# Patient Record
Sex: Female | Born: 1970 | Race: White | Hispanic: No | Marital: Married | Smoking: Former smoker
Health system: Southern US, Community
[De-identification: ages and names within clinical notes are randomized; demographics above are authoritative.]

## PROBLEM LIST (undated history)

## (undated) DIAGNOSIS — C44300 Unspecified malignant neoplasm of skin of unspecified part of face: Secondary | ICD-10-CM

## (undated) DIAGNOSIS — E119 Type 2 diabetes mellitus without complications: Secondary | ICD-10-CM

## (undated) HISTORY — DX: Type 2 diabetes mellitus without complications: E11.9

## (undated) HISTORY — PX: ABDOMINAL HYSTERECTOMY: SHX81

## (undated) HISTORY — PX: APPENDECTOMY: SHX54

## (undated) HISTORY — PX: SKIN CANCER EXCISION: SHX779

## (undated) HISTORY — PX: INCISION AND DRAINAGE ABSCESS: SHX5864

## (undated) HISTORY — PX: SKIN BIOPSY: SHX1

---

## 2010-05-17 LAB — HM PAP SMEAR: HM Pap smear: NORMAL

## 2010-12-16 LAB — HM MAMMOGRAPHY: HM Mammogram: NORMAL

## 2015-05-26 ENCOUNTER — Encounter: Payer: Self-pay | Admitting: Gynecology

## 2015-05-26 ENCOUNTER — Ambulatory Visit
Admission: EM | Admit: 2015-05-26 | Discharge: 2015-05-26 | Disposition: A | Discharge status: Home / Self Care | Payer: BLUE CROSS/BLUE SHIELD | Attending: Family Medicine | Admitting: Family Medicine

## 2015-05-26 DIAGNOSIS — N762 Acute vulvitis: Secondary | ICD-10-CM

## 2015-05-26 DIAGNOSIS — N764 Abscess of vulva: Secondary | ICD-10-CM

## 2015-05-26 HISTORY — DX: Unspecified malignant neoplasm of skin of unspecified part of face: C44.300

## 2015-05-26 MED ORDER — ONDANSETRON 4 MG PO TBDP: 4.0000 mg | ORAL_TABLET | Freq: Three times a day (TID) | ORAL | Status: DC | PRN

## 2015-05-26 MED ORDER — SULFAMETHOXAZOLE-TRIMETHOPRIM 800-160 MG PO TABS
2.0000 | ORAL_TABLET | Freq: Once | ORAL | Status: AC
  Administered 2015-05-26: 2 via ORAL

## 2015-05-26 MED ORDER — SULFAMETHOXAZOLE-TRIMETHOPRIM 400-80 MG PO TABS: 2.0000 | ORAL_TABLET | Freq: Once | ORAL | Status: DC

## 2015-05-26 MED ORDER — SULFAMETHOXAZOLE-TRIMETHOPRIM 800-160 MG PO TABS: 2.0000 | ORAL_TABLET | Freq: Two times a day (BID) | ORAL | Status: DC

## 2015-05-26 MED ORDER — OXYCODONE-ACETAMINOPHEN 5-325 MG PO TABS: 1.0000 | ORAL_TABLET | Freq: Three times a day (TID) | ORAL | Status: DC | PRN

## 2015-05-26 NOTE — Discharge Instructions (Signed)
Take medication as prescribed. Apply warm compresses multiple times per day as discussed. Use sitz baths. Keep clean. Do not pull packing out.   Follow up with your primary care physician or the above in 2 days, see above to call first thing tomorrow morning. Return to Urgent care in 2 days if unable to be seen.   Return to Urgent care sooner or go to ER for fever, increased pain, difficulty walking, urinary or bowel changes, inability to tolerate food or fluids or medications, new or worsening concerns.   Return to Urgent   Abscess An abscess is an infected area that contains a collection of pus and debris.It can occur in almost any part of the body. An abscess is also known as a furuncle or boil. CAUSES  An abscess occurs when tissue gets infected. This can occur from blockage of oil or sweat glands, infection of hair follicles, or a minor injury to the skin. As the body tries to fight the infection, pus collects in the area and creates pressure under the skin. This pressure causes pain. People with weakened immune systems have difficulty fighting infections and get certain abscesses more often.  SYMPTOMS Usually an abscess develops on the skin and becomes a painful mass that is red, warm, and tender. If the abscess forms under the skin, you may feel a moveable soft area under the skin. Some abscesses break open (rupture) on their own, but most will continue to get worse without care. The infection can spread deeper into the body and eventually into the bloodstream, causing you to feel ill.  DIAGNOSIS  Your caregiver will take your medical history and perform a physical exam. A sample of fluid may also be taken from the abscess to determine what is causing your infection. TREATMENT  Your caregiver may prescribe antibiotic medicines to fight the infection. However, taking antibiotics alone usually does not cure an abscess. Your caregiver may need to make a small cut (incision) in the abscess to  drain the pus. In some cases, gauze is packed into the abscess to reduce pain and to continue draining the area. HOME CARE INSTRUCTIONS   Only take over-the-counter or prescription medicines for pain, discomfort, or fever as directed by your caregiver.  If you were prescribed antibiotics, take them as directed. Finish them even if you start to feel better.  If gauze is used, follow your caregiver's directions for changing the gauze.  To avoid spreading the infection:  Keep your draining abscess covered with a bandage.  Wash your hands well.  Do not share personal care items, towels, or whirlpools with others.  Avoid skin contact with others.  Keep your skin and clothes clean around the abscess.  Keep all follow-up appointments as directed by your caregiver. SEEK MEDICAL CARE IF:   You have increased pain, swelling, redness, fluid drainage, or bleeding.  You have muscle aches, chills, or a general ill feeling.  You have a fever. MAKE SURE YOU:   Understand these instructions.  Will watch your condition.  Will get help right away if you are not doing well or get worse.   This information is not intended to replace advice given to you by your health care provider. Make sure you discuss any questions you have with your health care provider.   Document Released: 02/10/2005 Document Revised: 11/02/2011 Document Reviewed: 07/16/2011 Elsevier Interactive Patient Education [employer].

## 2015-05-26 NOTE — ED Notes (Signed)
Patient c/o left side labia / vaginal swelling / lump x 3 days. Patient stated warm to the touch / painful and fever at home of 101.3. Per patient pus / drainage x yesterday.

## 2015-05-26 NOTE — ED Provider Notes (Signed)
[facility]  ____________________________________________  Time seen: Approximately 8:27 PM  I have reviewed the triage vital signs and the nursing notes.   HISTORY  Chief Complaint Groin Swelling   HPI Linda Peterson is a 45 y.o. female presents with complaints of left labial redness and swelling. Patient reports that this onset has been over 3 days. Patient reports that yesterday after sitting in a bathtub she squeezed the area very firmly and caused some drainage to be expressed. Patient states that she has not had any drainage since that time. Reports the area continues to have some more swolling. Reports is very tender to touch. Also reports that when she walks feels that her pants rub the area causing increased pain. Denies history of similar in the past. Denies history of abscesses or MRSA infections. Patient reports that she felt like she had ingrown hair that initiated this problem. Denies any vaginal complaints, vaginal discharge, vaginal complaints. Denies any changes in sexual partners. Denies dysuria or difficulty with bowel movements. Denies nausea, vomiting or diarrhea. Denies rectal pain.  States current pain is 5 out of 10 throbbing and tender to touch. Denies pain radiation. Patient also reports that yesterday that she did have a fever at MAXIMUM TEMPERATURE 101.3 orally. Denies recent fevers otherwise. Reports is taken with over-the-counter ibuprofen and Tylenol today. Patient states she had to leave work today early and needs work note. Reports history of total hysterectomy.  Denies other pain or complaints.  PCP: in [place]   Past Medical History  Diagnosis Date  . Skin cancer of face     There are no active problems to display for this patient.   Past Surgical History  Procedure Laterality Date  . Abdominal hysterectomy    . Appendectomy    . Skin biopsy      face  . Skin cancer excision      Current Outpatient Rx  Name  Route  Sig  Dispense   Refill  . ibuprofen (ADVIL,MOTRIN) 200 MG tablet   Oral   Take 200 mg by mouth every 6 (six) hours as needed.           Allergies Review of patient's allergies indicates no known allergies.  No family history on file.  Social History Social History  Substance Use Topics  . Smoking status: Never Smoker   . Smokeless tobacco: None  . Alcohol Use: Yes    Review of Systems Constitutional: Report of fever Eyes: No visual changes. ENT: No sore throat. Cardiovascular: Denies chest pain. Respiratory: Denies shortness of breath. Gastrointestinal: No abdominal pain.  No nausea, no vomiting.  No diarrhea.  No constipation. Genitourinary: Negative for dysuria. Left labial redness and swelling. Musculoskeletal: Negative for back pain. Skin: Negative for rash. Neurological: Negative for headaches, focal weakness or numbness.  10-point ROS otherwise negative.  ____________________________________________   PHYSICAL EXAM:  VITAL SIGNS: ED Triage Vitals  Enc Vitals Group     BP 05/26/15 1949 123/85 mmHg     Pulse Rate 05/26/15 1949 102 Recheck 96      Resp 05/26/15 1949 16     Temp 05/26/15 1949 98.3 F (36.8 C)     Temp Source 05/26/15 1949 Oral     SpO2 05/26/15 1949 99 %     Weight 05/26/15 1949 180 lb (81.647 kg)     Height 05/26/15 1949 5\' 5"  (1.651 m)     Head Cir --      Peak Flow --  Pain Score --      Pain Loc --      Pain Edu? --      Excl. in [place]? --     Constitutional: Alert and oriented. Well appearing and in no acute distress. Eyes: Conjunctivae are normal. PERRL. EOMI. Head: Atraumatic.  Nose: No congestion/rhinnorhea.  Mouth/Throat: Mucous membranes are moist. Cardiovascular: Normal rate, regular rhythm. Grossly normal heart sounds.  Good peripheral circulation. Respiratory: Normal respiratory effort.  No retractions. Lungs CTAB. Gastrointestinal: Soft and nontender. Obese abdomen. Normal Bowel sounds.   Musculoskeletal: No lower or upper  extremity tenderness nor edema. Genitourinary: Exam performed with RN Annamary Carolin at bedside. Left lower exterior labia moderate swelling, mild to moderate erythema, mild to moderate induration with mild fluctuance, slight erythema and induration extending towards left upper leg at inguinal crease, no perirectal induration or fluctuance or erythema, left lateral labial superficial crusting noted at area consistent with location for patient reported active drainage yesterday, mild to moderate tenderness to palpation. No other rash or lesion noted. No perineal tenderness or erythema or induration.  Neurologic:  Normal speech and language. No gross focal neurologic deficits are appreciated. No gait instability. Skin:  Skin is warm, dry and intact. No rash noted. Psychiatric: Mood and affect are normal. Speech and behavior are normal.  ____________________________________________   LABS (all labs ordered are listed, but only abnormal results are displayed)  Labs Reviewed  WOUND CULTURE     PROCEDURES  Procedure(s) performed:   Procedure(s) performed:  Procedure(s) performed:  Procedure explained and verbal consent obtained. Consent: Verbal consent obtained. Written consent not obtained. Risks and benefits: risks, benefits and alternatives were discussed Patient identity confirmed: verbally with patient and hospital-assigned identification number  Consent given by: patient   I&D performed with Annamary Carolin RN at bedside. I&D abscess Location: Left labia Preparation: Patient was prepped and draped in the usual sterile fashion. Anesthesia with 1% Lidocaine 5 mls Irrigation solution: betadine Amount of cleaning: copious Incision made with #11 blade scalpel Wound culture obtained Moderate purulent drainage immediately obtained with expression. Sterile forceps used to probe and break up loculations.  1/4 " iodoform gauze used and packed.  Patient tolerate well. Wound well approximated post  repair.  dressing applied.  Wound care instructions provided.  Observe for any signs of infection or other problems.    ____________________________________________   INITIAL IMPRESSION / ASSESSMENT AND PLAN / ED COURSE  Pertinent labs & imaging results that were available during my care of the patient were reviewed by me and considered in my medical decision making (see chart for details).  Very well-appearing patient. No acute distress. Presents with complaints of 3 days of left labial redness and swelling. Reports some drainage yesterday but none since. Denies other rash or complaints. Subjective report of fevers at home. Afebrile at this time. Reports continues to eat and drink well. Denies other complaints. Patient with left labial cellulitis and abscess. Left labial abscess I&D performed with moderate amount purulent drainage immediate return, sterile forceps then used to probe and break up loculations, iodoform gauze used to pack. Patient tolerated well. Patient does report improved pain post I&D.   Discussed with patient importance of close follow-up and monitoring area. Encouraged sitz baths and warm compresses and dressing changes. Encouraged patient to have follow-up within 2 days. Information for general outpatient surgery given, or patient follow-up with primary care physician. Discussed if unable to be seen then to return to urgent care. Will treat patient with oral Bactrim,  when necessary Zofran as well as when necessary Percocet.  Discussed follow up with Primary care physician this week. Discussed follow up and return parameters including return to urgent care or ER for increased pain, inability to tolerate food or fluids or medications, continued fever, increased redness or swelling, abdominal pain, rectal pain, no resolution or any worsening concerns. Patient verbalized understanding and agreed to plan.   ____________________________________________   FINAL CLINICAL  IMPRESSION(S) / ED DIAGNOSES  Final diagnoses:  Left genital labial abscess  Cellulitis of labia       Marylene Land, NP 05/26/15 2132

## 2015-05-27 ENCOUNTER — Telehealth: Payer: Self-pay

## 2015-05-27 NOTE — ED Notes (Signed)
Patient called stating [facility] does not deal with pelvic area on women. This nurse contacted [facility] at [phone] and appointment made for tomorrow January 11th at 1000hrs. Be there 10 minutes ahead of time. Pt. Given directions to location of [facility]

## 2015-05-28 ENCOUNTER — Inpatient Hospital Stay
Admission: AD | Admit: 2015-05-28 | Discharge: 2015-06-03 | DRG: 758 | Disposition: A | Discharge status: Home / Self Care | Payer: BLUE CROSS/BLUE SHIELD | Source: Ambulatory Visit | Attending: Obstetrics and Gynecology | Admitting: Obstetrics and Gynecology

## 2015-05-28 ENCOUNTER — Ambulatory Visit (INDEPENDENT_AMBULATORY_CARE_PROVIDER_SITE_OTHER): Payer: BLUE CROSS/BLUE SHIELD | Admitting: Obstetrics and Gynecology

## 2015-05-28 ENCOUNTER — Encounter: Payer: Self-pay | Admitting: Obstetrics and Gynecology

## 2015-05-28 VITALS — BP 163/93 | HR 116 | Ht 65.0 in | Wt 221.9 lb

## 2015-05-28 DIAGNOSIS — K5900 Constipation, unspecified: Secondary | ICD-10-CM | POA: Diagnosis present

## 2015-05-28 DIAGNOSIS — Z87891 Personal history of nicotine dependence: Secondary | ICD-10-CM

## 2015-05-28 DIAGNOSIS — E1165 Type 2 diabetes mellitus with hyperglycemia: Secondary | ICD-10-CM | POA: Diagnosis present

## 2015-05-28 DIAGNOSIS — N762 Acute vulvitis: Secondary | ICD-10-CM | POA: Diagnosis present

## 2015-05-28 DIAGNOSIS — Z85828 Personal history of other malignant neoplasm of skin: Secondary | ICD-10-CM

## 2015-05-28 DIAGNOSIS — N764 Abscess of vulva: Principal | ICD-10-CM | POA: Diagnosis present

## 2015-05-28 DIAGNOSIS — B951 Streptococcus, group B, as the cause of diseases classified elsewhere: Secondary | ICD-10-CM | POA: Diagnosis present

## 2015-05-28 DIAGNOSIS — K644 Residual hemorrhoidal skin tags: Secondary | ICD-10-CM | POA: Diagnosis present

## 2015-05-28 DIAGNOSIS — L0390 Cellulitis, unspecified: Secondary | ICD-10-CM

## 2015-05-28 DIAGNOSIS — E119 Type 2 diabetes mellitus without complications: Secondary | ICD-10-CM

## 2015-05-28 DIAGNOSIS — L03317 Cellulitis of buttock: Secondary | ICD-10-CM | POA: Diagnosis present

## 2015-05-28 DIAGNOSIS — Z79891 Long term (current) use of opiate analgesic: Secondary | ICD-10-CM

## 2015-05-28 DIAGNOSIS — L0291 Cutaneous abscess, unspecified: Secondary | ICD-10-CM

## 2015-05-28 DIAGNOSIS — R638 Other symptoms and signs concerning food and fluid intake: Secondary | ICD-10-CM | POA: Diagnosis not present

## 2015-05-28 DIAGNOSIS — Z79899 Other long term (current) drug therapy: Secondary | ICD-10-CM

## 2015-05-28 LAB — CBC WITH DIFFERENTIAL/PLATELET
BASOS ABS: 0 10*3/uL (ref 0–0.1)
Basophils Relative: 0 %
EOS ABS: 0.2 10*3/uL (ref 0–0.7)
EOS PCT: 2 %
HCT: 37.4 % (ref 35.0–47.0)
HEMOGLOBIN: 13 g/dL (ref 12.0–16.0)
LYMPHS ABS: 1.9 10*3/uL (ref 1.0–3.6)
LYMPHS PCT: 18 %
MCH: 29.6 pg (ref 26.0–34.0)
MCHC: 34.8 g/dL (ref 32.0–36.0)
MCV: 85.2 fL (ref 80.0–100.0)
Monocytes Absolute: 0.7 10*3/uL (ref 0.2–0.9)
Monocytes Relative: 7 %
NEUTROS PCT: 73 %
Neutro Abs: 7.9 10*3/uL — ABNORMAL HIGH (ref 1.4–6.5)
PLATELETS: 383 10*3/uL (ref 150–440)
RBC: 4.39 MIL/uL (ref 3.80–5.20)
RDW: 13.5 % (ref 11.5–14.5)
WBC: 10.8 10*3/uL (ref 3.6–11.0)

## 2015-05-28 LAB — BASIC METABOLIC PANEL
Anion gap: 7 (ref 5–15)
BUN: 7 mg/dL (ref 6–20)
CHLORIDE: 101 mmol/L (ref 101–111)
CO2: 22 mmol/L (ref 22–32)
CREATININE: 0.53 mg/dL (ref 0.44–1.00)
Calcium: 8.6 mg/dL — ABNORMAL LOW (ref 8.9–10.3)
Glucose, Bld: 346 mg/dL — ABNORMAL HIGH (ref 65–99)
POTASSIUM: 3.6 mmol/L (ref 3.5–5.1)
SODIUM: 130 mmol/L — AB (ref 135–145)

## 2015-05-28 LAB — RAPID HIV SCREEN (HIV 1/2 AB+AG)
HIV 1/2 Antibodies: NONREACTIVE
HIV-1 P24 ANTIGEN - HIV24: NONREACTIVE

## 2015-05-28 LAB — GLUCOSE, CAPILLARY
GLUCOSE-CAPILLARY: 385 mg/dL — AB (ref 65–99)
Glucose-Capillary: 193 mg/dL — ABNORMAL HIGH (ref 65–99)

## 2015-05-28 MED ORDER — ONDANSETRON HCL 4 MG/2ML IJ SOLN
4.0000 mg | Freq: Four times a day (QID) | INTRAMUSCULAR | Status: DC | PRN
  Administered 2015-05-28: 4 mg via INTRAVENOUS
  Filled 2015-05-28: qty 2

## 2015-05-28 MED ORDER — DOCUSATE SODIUM 100 MG PO CAPS
100.0000 mg | ORAL_CAPSULE | Freq: Two times a day (BID) | ORAL | Status: DC
  Administered 2015-05-28 – 2015-06-03 (×13): 100 mg via ORAL
  Filled 2015-05-28 (×13): qty 1

## 2015-05-28 MED ORDER — KETOROLAC TROMETHAMINE 30 MG/ML IJ SOLN
30.0000 mg | Freq: Four times a day (QID) | INTRAMUSCULAR | Status: DC
  Administered 2015-05-28 (×2): 30 mg via INTRAVENOUS
  Filled 2015-05-28 (×2): qty 1

## 2015-05-28 MED ORDER — VANCOMYCIN HCL IN DEXTROSE 1-5 GM/200ML-% IV SOLN
1000.0000 mg | Freq: Two times a day (BID) | INTRAVENOUS | Status: DC
  Administered 2015-05-29 – 2015-05-31 (×5): 1000 mg via INTRAVENOUS
  Filled 2015-05-28 (×6): qty 200

## 2015-05-28 MED ORDER — KETOROLAC TROMETHAMINE 30 MG/ML IJ SOLN: 30.0000 mg | Freq: Four times a day (QID) | INTRAMUSCULAR | Status: DC

## 2015-05-28 MED ORDER — ONDANSETRON HCL 4 MG PO TABS: 4.0000 mg | ORAL_TABLET | Freq: Four times a day (QID) | ORAL | Status: DC | PRN

## 2015-05-28 MED ORDER — OXYCODONE-ACETAMINOPHEN 5-325 MG PO TABS
1.0000 | ORAL_TABLET | ORAL | Status: DC | PRN
  Administered 2015-05-28 – 2015-05-29 (×4): 2 via ORAL
  Administered 2015-05-29 (×2): 1 via ORAL
  Administered 2015-05-29 – 2015-05-30 (×7): 2 via ORAL
  Administered 2015-05-30: 1 via ORAL
  Administered 2015-05-31 – 2015-06-02 (×14): 2 via ORAL
  Administered 2015-06-02: 1 via ORAL
  Administered 2015-06-02 – 2015-06-03 (×4): 2 via ORAL
  Filled 2015-05-28 (×4): qty 2
  Filled 2015-05-28: qty 1
  Filled 2015-05-28 (×11): qty 2
  Filled 2015-05-28: qty 1
  Filled 2015-05-28 (×8): qty 2
  Filled 2015-05-28: qty 1
  Filled 2015-05-28 (×2): qty 2
  Filled 2015-05-28: qty 1
  Filled 2015-05-28 (×4): qty 2
  Filled 2015-05-28: qty 1
  Filled 2015-05-28: qty 2

## 2015-05-28 MED ORDER — SODIUM CHLORIDE 0.9 % IV SOLN
2000.0000 mg | Freq: Once | INTRAVENOUS | Status: AC
  Administered 2015-05-28: 2000 mg via INTRAVENOUS
  Filled 2015-05-28: qty 2000

## 2015-05-28 MED ORDER — KETOROLAC TROMETHAMINE 30 MG/ML IJ SOLN
30.0000 mg | Freq: Four times a day (QID) | INTRAMUSCULAR | Status: DC
Start: 2015-05-29 — End: 2015-05-29
  Administered 2015-05-29 (×4): 30 mg via INTRAVENOUS
  Filled 2015-05-28 (×4): qty 1

## 2015-05-28 MED ORDER — MORPHINE SULFATE (PF) 2 MG/ML IV SOLN: 1.0000 mg | INTRAVENOUS | Status: DC | PRN

## 2015-05-28 MED ORDER — VANCOMYCIN HCL IN DEXTROSE 1-5 GM/200ML-% IV SOLN
1000.0000 mg | Freq: Once | INTRAVENOUS | Status: DC
  Filled 2015-05-28 (×2): qty 200

## 2015-05-28 MED ORDER — INSULIN ASPART 100 UNIT/ML ~~LOC~~ SOLN
0.0000 [IU] | SUBCUTANEOUS | Status: DC
  Administered 2015-05-28: 15 [IU] via SUBCUTANEOUS
  Administered 2015-05-28: 3 [IU] via SUBCUTANEOUS
  Administered 2015-05-29 (×3): 8 [IU] via SUBCUTANEOUS
  Filled 2015-05-28: qty 15
  Filled 2015-05-28: qty 8
  Filled 2015-05-28: qty 3
  Filled 2015-05-28 (×2): qty 8

## 2015-05-28 MED ORDER — LACTATED RINGERS IV SOLN
INTRAVENOUS | Status: DC
  Administered 2015-05-28 – 2015-05-31 (×6): via INTRAVENOUS

## 2015-05-28 NOTE — H&P (Signed)
GYN ENCOUNTER NOTE  Subjective:       Linda Peterson is a 45 y.o. [phone] female Admitted for IV antibiotics and IV analgesia regarding a severely symptomatic left labia majora abscess/cellulitis, status post incision and drainage at an urgent care center 2 days prior.  Patient reports having fevers to as high as 101.34F.  Pain has progressively worsened to the point where she cannot sit normally on a chair. Evaluation in the office today was notable for a markedly indurated left labia majora with hyperemia of the inguinal region and left gluteus.  There was no evidence of Bartholin's gland abscess or gluteus abscess.  However, because of the degree of involvement of both the gluteus and labia, which appears to have not been responsive to oral Septra DS therapy, she was admitted for treatment. Additional incision drainage was attempted in the office but discontinued due to extreme patient discomfort and lack of production of purulent drainage.  Gynecologic History No LMP recorded. Patient has had a hysterectomy. Contraception: Hysterectomy Last Pap: Normal  Obstetric History OB History  Gravida Para Term Preterm AB SAB TAB Ectopic Multiple Living  4 2 2  1  1   3     # Outcome Date GA Lbr Len/2nd Weight Sex Delivery Anes PTL Lv  4 Term 1995   8 lb 8 oz (3.856 kg) M Vag-Spont   Y  3 Term 1992   6 lb 6.4 oz (2.903 kg) F Vag-Spont   Y  2 Gravida 1989   8 lb 1.6 oz (3.674 kg) F Vag-Spont   Y  1 TAB 1987              Past Medical History  Diagnosis Date  . Skin cancer of face     Past Surgical History  Procedure Laterality Date  . Appendectomy    . Skin biopsy      face  . Skin cancer excision    . Abdominal hysterectomy      No current facility-administered medications on file prior to encounter.   Current Outpatient Prescriptions on File Prior to Encounter  Medication Sig Dispense Refill  . ibuprofen (ADVIL,MOTRIN) 200 MG tablet Take 200 mg by mouth every 6 (six) hours as  needed.    . ondansetron (ZOFRAN ODT) 4 MG disintegrating tablet Take 1 tablet (4 mg total) by mouth every 8 (eight) hours as needed for nausea or vomiting. 15 tablet 0  . oxyCODONE-acetaminophen (ROXICET) 5-325 MG tablet Take 1 tablet by mouth every 8 (eight) hours as needed for moderate pain or severe pain (Do not drive or operate heavy machinery while taking as can cause drowsiness.). 9 tablet 0  . sulfamethoxazole-trimethoprim (BACTRIM DS,SEPTRA DS) 800-160 MG tablet Take 2 tablets by mouth 2 (two) times daily. 40 tablet 0    No Known Allergies  Social History   Social History  . Marital Status: Married    Spouse Name: N/A  . Number of Children: N/A  . Years of Education: N/A   Occupational History  . Not on file.   Social History Main Topics  . Smoking status: Never Smoker   . Smokeless tobacco: Not on file  . Alcohol Use: Yes     Comment: occas  . Drug Use: No  . Sexual Activity: Not Currently   Other Topics Concern  . Not on file   Social History Narrative    Family History  Problem Relation Age of Onset  . Diabetes Father   . Colon cancer Maternal  Grandmother   . Ovarian cancer Paternal Grandmother   . Diabetes Paternal Grandmother   . Breast cancer Paternal Grandfather     The following portions of the patient's history were reviewed and updated as appropriate: allergies, current medications, past family history, past medical history, past social history, past surgical history and problem list.    Review of Systems  Constitutional: Positive for fever, chills, malaise/fatigue and diaphoresis. Negative for weight loss.  HENT: Negative.   Respiratory: Negative.   Cardiovascular: Negative.   Gastrointestinal: Negative.   Genitourinary: Negative.   Musculoskeletal: Negative.   Skin: Positive for rash. Negative for itching.  Neurological: Positive for weakness.  Endo/Heme/Allergies: Negative.   Psychiatric/Behavioral: Negative.      Objective:   BP  102/63 mmHg  Pulse 81  Temp(Src) 97.9 F (36.6 C) (Oral)  Resp 16  SpO2 97% CONSTITUTIONAL: Well-developed, well-nourished female in Moderate discomfort, unable to sit normally in a chair HENT:  Normocephalic, atraumatic. Oropharynx clear NECK: Normal range of motion, supple, no masses.  Normal thyroid.  SKIN: Skin is warm and dry. No rash noted. Not diaphoretic. No erythema. No pallor. [place]: Alert and oriented to person, place, and time. PSYCHIATRIC: Normal mood and affect. Normal behavior. Normal judgment and thought content. CARDIOVASCULAR:Regular rate and rhythm without murmur RESPIRATORY: Clear BREASTS: Not Examined ABDOMEN: Soft, non distended; Non tender.  No Organomegaly. PELVIC:  External Genitalia: Hyperemic indurated left labia majora with quarter inch Nu Gauze packing removed from I&D site; minimal purulent drainage; right labium minora/majora normal  BUS: Normal  Vagina: Normal Without palpable abnormalities  Cervix: Surgically absent  Uterus: Surgically absent  Adnexa: Not assessed  RV: External hemorrhoids present; sphincter tone normal; no rectal mass appreciated; left gluteus hyperemic, tender without palpable mass; right gluteus, normal  Bladder: Nontender MUSCULOSKELETAL: Normal range of motion. No tenderness.  No cyanosis, clubbing, or edema.     Assessment:   Left labia majora abscess, status post incision and drainage at urgent care center 2 days ago, with lack of clinical improvement on oral Septra DS; Possible extension of cellulitis to Left gluteus  Plan:   1.  IV vancomycin. 2.  IV analgesics. 3.  CBC, CMP, blood cultures. 4.  Check wound culture

## 2015-05-28 NOTE — Consult Note (Signed)
ANTIBIOTIC CONSULT NOTE - INITIAL  Pharmacy Consult for vancomycin Indication: cellulitis  No Known Allergies  Patient Measurements:   Adjusted Body Weight:   Vital Signs: Temp: 98 F (36.7 C) (01/11 1210) Temp Source: Oral (01/11 1210) BP: 119/63 mmHg (01/11 1210) Pulse Rate: 81 (01/11 1210) Intake/Output from previous day:   Intake/Output from this shift:    Labs:  Recent Labs  05/28/15 1221  WBC 10.8  HGB 13.0  PLT 383  CREATININE 0.53   Estimated Creatinine Clearance: 103.3 mL/min (by C-G formula based on Cr of 0.53). No results for input(s): VANCOTROUGH, VANCOPEAK, VANCORANDOM, GENTTROUGH, GENTPEAK, GENTRANDOM, TOBRATROUGH, TOBRAPEAK, TOBRARND, AMIKACINPEAK, AMIKACINTROU, AMIKACIN in the last 72 hours.   Microbiology: Recent Results (from the past 720 hour(s))  Wound culture     Status: None (Preliminary result)   Collection Time: 05/26/15  8:29 PM  Result Value Ref Range Status   Specimen Description ABSCESS  Final   Special Requests Normal  Final   Gram Stain   Final    FEW WBC SEEN MODERATE GRAM POSITIVE COCCI IN CHAINS    Culture   Final    MODERATE GROWTH GROUP B STREP(S.AGALACTIAE)ISOLATED Virtually 100% of S. agalactiae (Group B) strains are susceptible to Penicillin.  For Penicillin-allergic patients, Erythromycin (85-95% sensitive) and Clindamycin (80% sensitive) are drugs of choice. Contact microbiology lab to request sensitivities if  needed within 7 days.    Report Status PENDING  Incomplete    Medical History: Past Medical History  Diagnosis Date  . Skin cancer of face     Medications:  Scheduled:  . docusate sodium  100 mg Oral BID  . ketorolac  30 mg Intravenous 4 times per day   Or  . ketorolac  30 mg Intramuscular 4 times per day  . vancomycin  2,000 mg Intravenous Once  . [START ON 05/29/2015] vancomycin  1,000 mg Intravenous Q12H   Assessment: Pt is a 45 year old female who presents with labial redness and swelling with some  drainage. Pharmacy consulted to dose vancomycin for celulitis  Goal of Therapy:  Vancomycin trough level 10-15 mcg/ml  Plan:  Measure antibiotic drug levels at steady state Follow up culture results vancomycin 2g once then 1g q 12 hours. will draw trough at steady state 1/14 @ 0400. Pharmacy to continue to monitor. Thank you for the consult.  Ramond Dial 05/28/2015,3:22 PM

## 2015-05-28 NOTE — Progress Notes (Signed)
Inpatient Diabetes Program Recommendations  AACE/ADA: New Consensus Statement on Inpatient Glycemic Control (2015)  Target Ranges:  Prepandial:   less than 140 mg/dL      Peak postprandial:   less than 180 mg/dL (1-2 hours)      Critically ill patients:  140 - 180 mg/dL  Results for GENESI, BUFFIN (MRN [id number]) as of 05/28/2015 14:39  Ref. Range 05/28/2015 12:21  Glucose Latest Ref Range: 65-99 mg/dL 346 (H)   Review of Glycemic Control  Diabetes history: No Outpatient Diabetes medications: NA Current orders for Inpatient glycemic control: None  Inpatient Diabetes Program Recommendations: Correction (SSI): Initial glucose 346 mg/dl. Please order CBGs with Novolog correction scale ACHS. HgbA1C: Please order an A1C to evaluate glycemic control over the past 2-3 months.  Thanks, Barnie Alderman, RN, MSN, CDE Diabetes Coordinator Inpatient Diabetes Program [phone] (Team Pager from [place] to [place]) [phone] ([place] office) [phone] [facility] office) [phone] [facility] office)

## 2015-05-29 LAB — GLUCOSE, CAPILLARY
GLUCOSE-CAPILLARY: 314 mg/dL — AB (ref 65–99)
GLUCOSE-CAPILLARY: 268 mg/dL — AB (ref 65–99)
Glucose-Capillary: 251 mg/dL — ABNORMAL HIGH (ref 65–99)
Glucose-Capillary: 289 mg/dL — ABNORMAL HIGH (ref 65–99)
Glucose-Capillary: 314 mg/dL — ABNORMAL HIGH (ref 65–99)
Glucose-Capillary: 347 mg/dL — ABNORMAL HIGH (ref 65–99)
Glucose-Capillary: 283 mg/dL — ABNORMAL HIGH (ref 65–99)

## 2015-05-29 LAB — HEMOGLOBIN A1C: HEMOGLOBIN A1C: 11.2 % — AB (ref 4.0–6.0)

## 2015-05-29 LAB — WOUND CULTURE: SPECIAL REQUESTS: NORMAL

## 2015-05-29 MED ORDER — BISACODYL 10 MG RE SUPP
10.0000 mg | Freq: Once | RECTAL | Status: AC
  Administered 2015-05-29: 10 mg via RECTAL
  Filled 2015-05-29: qty 1

## 2015-05-29 MED ORDER — INSULIN ASPART 100 UNIT/ML ~~LOC~~ SOLN: 0.0000 [IU] | Freq: Every day | SUBCUTANEOUS | Status: DC

## 2015-05-29 MED ORDER — INSULIN ASPART 100 UNIT/ML ~~LOC~~ SOLN: 0.0000 [IU] | Freq: Three times a day (TID) | SUBCUTANEOUS | Status: DC

## 2015-05-29 MED ORDER — LIVING WELL WITH DIABETES BOOK
Freq: Once | Status: AC
Start: 2015-05-29 — End: 2015-05-29
  Administered 2015-05-29: 10:00:00
  Filled 2015-05-29: qty 1

## 2015-05-29 MED ORDER — INSULIN ASPART 100 UNIT/ML ~~LOC~~ SOLN
5.0000 [IU] | Freq: Three times a day (TID) | SUBCUTANEOUS | Status: DC
  Administered 2015-05-29 – 2015-05-30 (×4): 5 [IU] via SUBCUTANEOUS
  Administered 2015-05-31: 100 [IU] via SUBCUTANEOUS
  Administered 2015-05-31 – 2015-06-02 (×8): 5 [IU] via SUBCUTANEOUS
  Filled 2015-05-29 (×13): qty 5

## 2015-05-29 MED ORDER — BENZOCAINE-MENTHOL 20-0.5 % EX AERO
1.0000 "application " | INHALATION_SPRAY | Freq: Four times a day (QID) | CUTANEOUS | Status: DC | PRN
  Administered 2015-05-29: 1 via TOPICAL
  Filled 2015-05-29: qty 56

## 2015-05-29 MED ORDER — KETOROLAC TROMETHAMINE 30 MG/ML IJ SOLN
30.0000 mg | Freq: Four times a day (QID) | INTRAMUSCULAR | Status: DC
  Administered 2015-05-30 – 2015-06-02 (×13): 30 mg via INTRAVENOUS
  Filled 2015-05-29 (×13): qty 1

## 2015-05-29 MED ORDER — INSULIN GLARGINE 100 UNIT/ML ~~LOC~~ SOLN
20.0000 [IU] | Freq: Every day | SUBCUTANEOUS | Status: DC
  Administered 2015-05-29 – 2015-06-02 (×5): 20 [IU] via SUBCUTANEOUS
  Filled 2015-05-29 (×6): qty 0.2

## 2015-05-29 MED ORDER — INSULIN ASPART 100 UNIT/ML ~~LOC~~ SOLN
0.0000 [IU] | Freq: Three times a day (TID) | SUBCUTANEOUS | Status: DC
  Administered 2015-05-29: 11 [IU] via SUBCUTANEOUS
  Administered 2015-05-30: 8 [IU] via SUBCUTANEOUS
  Administered 2015-05-30: 5 [IU] via SUBCUTANEOUS
  Administered 2015-05-30 – 2015-05-31 (×2): 3 [IU] via SUBCUTANEOUS
  Administered 2015-05-31: 5 [IU] via SUBCUTANEOUS
  Administered 2015-05-31: 3 [IU] via SUBCUTANEOUS
  Administered 2015-06-01: 5 [IU] via SUBCUTANEOUS
  Administered 2015-06-01: 3 [IU] via SUBCUTANEOUS
  Administered 2015-06-01: 2 [IU] via SUBCUTANEOUS
  Administered 2015-06-02: 3 [IU] via SUBCUTANEOUS
  Administered 2015-06-02: 5 [IU] via SUBCUTANEOUS
  Administered 2015-06-02: 2 [IU] via SUBCUTANEOUS
  Filled 2015-05-29 (×2): qty 5
  Filled 2015-05-29: qty 2
  Filled 2015-05-29: qty 11
  Filled 2015-05-29: qty 5
  Filled 2015-05-29: qty 2
  Filled 2015-05-29: qty 8
  Filled 2015-05-29 (×3): qty 3

## 2015-05-29 MED ORDER — INSULIN ASPART 100 UNIT/ML ~~LOC~~ SOLN
0.0000 [IU] | Freq: Every day | SUBCUTANEOUS | Status: DC
  Administered 2015-05-29: 8 [IU] via SUBCUTANEOUS
  Administered 2015-05-30 – 2015-05-31 (×2): 2 [IU] via SUBCUTANEOUS
  Filled 2015-05-29 (×2): qty 2
  Filled 2015-05-29: qty 8

## 2015-05-29 MED ORDER — KETOROLAC TROMETHAMINE 30 MG/ML IJ SOLN: 30.0000 mg | Freq: Four times a day (QID) | INTRAMUSCULAR | Status: DC

## 2015-05-29 NOTE — Progress Notes (Addendum)
Inpatient Diabetes Program Recommendations  AACE/ADA: New Consensus Statement on Inpatient Glycemic Control (2015)  Target Ranges:  Prepandial:   less than 140 mg/dL      Peak postprandial:   less than 180 mg/dL (1-2 hours)      Critically ill patients:  140 - 180 mg/dL   Review of Glycemic Control  Results for OTA, RACER (MRN [id number]) as of 05/29/2015 08:46  Ref. Range 05/28/2015 19:39 05/28/2015 23:33 05/29/2015 03:35 05/29/2015 08:03  Glucose-Capillary Latest Ref Range: 65-99 mg/dL 385 (H) 193 (H) 251 (H) 289 (H)   Diabetes history: No, A1C 11.2% Outpatient Diabetes medications: none Current orders for Inpatient glycemic control: Novolog 0-15 units q4h   Inpatient Diabetes Program Recommendations:  Since post prandial blood sugars remain elevated consider adding Novolog mealtime insulin Novolog 5 units tid.  Continue Novolog correction but consider changing it to Novolog 0-15 units tid and add Novolog correction 0-5 units hs.   A1C 11.2% - please consider starting basal insulin Lantus 20 units qday starting now (0.2 unit/kg)  Ordered Living Well with Diabetes book for patient.   Gentry Fitz, RN, BA, MHA, CDE Diabetes Coordinator Inpatient Diabetes Program  [phone] (Team Pager) [phone] ([place]) 05/29/2015 8:50 AM

## 2015-05-29 NOTE — Progress Notes (Addendum)
Inpatient Diabetes Program Recommendations  AACE/ADA: New Consensus Statement on Inpatient Glycemic Control (2015)  Target Ranges:  Prepandial:   less than 140 mg/dL      Peak postprandial:   less than 180 mg/dL (1-2 hours)      Critically ill patients:  140 - 180 mg/dL   Spoke with patient regarding elevated blood sugars and the current need for insulin.  Patient's husband has diabetes and she attended diabetes classes with him when he was diagnosed but she is open to outpatient diabetes education again-  Please add the diagnosis of diabetes to the patient chart (if appropriate) so outpatient diabetes education can be ordered and covered by her insurance.   Explained the need to check blood sugars when she is discharged home and the possibility of insulin on discharge.  Spoke with RN caring for the patient- she has reviewed rapid acting insulin with the patient and the treatment of hypoglycemia.  Encouraged the patient to review the Living Well with Diabetes booklet  While she was here.  Please send the patient home with an order for a glucometer, lancets, and testing strips (2x/day fasting and 2 hours post lunch or supper)  For further assistance with this patient, do not hesitate to call our team.  Gentry Fitz, RN, BA, MHA, CDE Diabetes Coordinator Inpatient Diabetes Program  [phone] (Team Pager) [phone] ([place]) 05/29/2015 11:41 AM

## 2015-05-29 NOTE — Progress Notes (Addendum)
Subjective: Patient reports decreased vulvar pain. No fever/chills/sweats.  Objective: I have reviewed patient's vital signs, intake and output, medications and labs. BP 100/55 mmHg  Pulse 77  Temp(Src) 98.7 F (37.1 C) (Oral)  Resp 20  Ht 5\' 5"  (1.651 m)  Wt 221 lb 14.4 oz (100.653 kg)  BMI 36.93 kg/m2  SpO2 95%  General: alert and cooperative GI: soft, nontender, without organomegaly Extremities: no edema, redness or tenderness in the calves or thighs Pelvic: Decreased induration, erythema of Lt Labia majora, and lt gluteus  CBC Latest Ref Rng 05/28/2015  WBC 3.6 - 11.0 K/uL 10.8  Hemoglobin 12.0 - 16.0 g/dL 13.0  Hematocrit 35.0 - 47.0 % 37.4  Platelets 150 - 440 K/uL 383   CMP Latest Ref Rng 05/28/2015  Glucose 65 - 99 mg/dL 346(H)  BUN 6 - 20 mg/dL 7  Creatinine 0.44 - 1.00 mg/dL 0.53  Sodium 135 - 145 mmol/L 130(L)  Potassium 3.5 - 5.1 mmol/L 3.6  Chloride 101 - 111 mmol/L 101  CO2 22 - 32 mmol/L 22  Calcium 8.9 - 10.3 mg/dL 8.6(L)     Assessment/Plan: Lt Labia Majora/gluteal cellulitis, responding to IV Vancomycin Continue current therapy; Modify Insulin coverage per Diabetic coordinator consultation (Lantus 20 units at night; Novolog 5 units with meals, and Sliding Scale 0-15 units with meals; Novolog Sliding Scale 0-5 units at night)  LOS: 1 day    Linda Peterson 05/29/2015, 5:49 PM

## 2015-05-30 LAB — GLUCOSE, CAPILLARY
GLUCOSE-CAPILLARY: 264 mg/dL — AB (ref 65–99)
GLUCOSE-CAPILLARY: 189 mg/dL — AB (ref 65–99)
Glucose-Capillary: 231 mg/dL — ABNORMAL HIGH (ref 65–99)
Glucose-Capillary: 204 mg/dL — ABNORMAL HIGH (ref 65–99)

## 2015-05-30 NOTE — Progress Notes (Addendum)
Subjective: Patient reports less perineal/gluteal pain. No fever. Tolerating po intake  Objective: I have reviewed patient's vital signs, intake and output, medications and microbiology.  General: alert, cooperative and less uncomfortable sitting. Labia with decreased swelling and decreased induration (6 cm x 3 cm ); Lt gluteus with minimal induration.  CBG: 200's Assessment/Plan: LT labia majora Cellulitis/Gluteus cellulitis improving; Culture GBS POSITIVE DM improving wioth insulin adjustments.  Continue current treatment plan with IV Vancomycin, Insulin coverage; IV to KVO   LOS: 2 days    Alanda Slim Laconda Basich 05/30/2015, 1:17 PM

## 2015-05-31 LAB — GLUCOSE, CAPILLARY
Glucose-Capillary: 250 mg/dL — ABNORMAL HIGH (ref 65–99)
Glucose-Capillary: 174 mg/dL — ABNORMAL HIGH (ref 65–99)
Glucose-Capillary: 192 mg/dL — ABNORMAL HIGH (ref 65–99)
Glucose-Capillary: 219 mg/dL — ABNORMAL HIGH (ref 65–99)

## 2015-05-31 LAB — VANCOMYCIN, TROUGH: VANCOMYCIN TR: 6 ug/mL — AB (ref 10–20)

## 2015-05-31 MED ORDER — VANCOMYCIN HCL IN DEXTROSE 1-5 GM/200ML-% IV SOLN
1000.0000 mg | Freq: Three times a day (TID) | INTRAVENOUS | Status: DC
  Administered 2015-05-31 – 2015-06-02 (×6): 1000 mg via INTRAVENOUS
  Filled 2015-05-31 (×8): qty 200

## 2015-05-31 MED ORDER — BISACODYL 10 MG RE SUPP: 10.0000 mg | Freq: Every day | RECTAL | Status: DC | PRN

## 2015-05-31 NOTE — Consult Note (Signed)
ANTIBIOTIC CONSULT NOTE - INITIAL  Pharmacy Consult for vancomycin Indication: cellulitis  No Known Allergies  Patient Measurements: Height: 5\' 5"  (165.1 cm) Weight: 221 lb 14.4 oz (100.653 kg) IBW/kg (Calculated) : 57 Adjusted Body Weight:   Vital Signs: Temp: 98.1 F (36.7 C) (01/14 0321) Temp Source: Oral (01/13 2311) BP: 116/67 mmHg (01/14 0321) Pulse Rate: 70 (01/14 0321) Intake/Output from previous day: 01/13 0701 - 01/14 0700 In: 2683.3 [P.O.:1280; I.V.:1203.3; IV Piggyback:200] Out: -  Intake/Output from this shift:    Labs:  Recent Labs  05/28/15 1221  WBC 10.8  HGB 13.0  PLT 383  CREATININE 0.53   Estimated Creatinine Clearance: 103.3 mL/min (by C-G formula based on Cr of 0.53).  Recent Labs  05/31/15 0411  [place] 6*     Microbiology: Recent Results (from the past 720 hour(s))  Wound culture     Status: None   Collection Time: 05/26/15  8:29 PM  Result Value Ref Range Status   Specimen Description ABSCESS  Final   Special Requests Normal  Final   Gram Stain   Final    FEW WBC SEEN MODERATE GRAM POSITIVE COCCI IN CHAINS    Culture   Final    MODERATE GROWTH GROUP B STREP(S.AGALACTIAE)ISOLATED Virtually 100% of S. agalactiae (Group B) strains are susceptible to Penicillin.  For Penicillin-allergic patients, Erythromycin (85-95% sensitive) and Clindamycin (80% sensitive) are drugs of choice. Contact microbiology lab to request sensitivities if  needed within 7 days.    Report Status 05/29/2015 FINAL  Final  Culture, blood (routine x 2)     Status: None (Preliminary result)   Collection Time: 05/28/15  3:03 PM  Result Value Ref Range Status   Specimen Description BLOOD RIGHT ASSIST CONTROL  Final   Special Requests   Final    BOTTLES DRAWN AEROBIC AND ANAEROBIC AERO 8CC ANA 2CC   Culture NO GROWTH 2 DAYS  Final   Report Status PENDING  Incomplete  Culture, blood (routine x 2)     Status: None (Preliminary result)   Collection Time:  05/28/15  3:07 PM  Result Value Ref Range Status   Specimen Description BLOOD LEFT ASSIST CONTROL  Final   Special Requests   Final    BOTTLES DRAWN AEROBIC AND ANAEROBIC  AERO [place] ANA [place]   Culture NO GROWTH 2 DAYS  Final   Report Status PENDING  Incomplete    Medical History: Past Medical History  Diagnosis Date  . Skin cancer of face     Medications:  Scheduled:  . docusate sodium  100 mg Oral BID  . insulin aspart  0-15 Units Subcutaneous TID WC  . insulin aspart  0-5 Units Subcutaneous QHS  . insulin aspart  5 Units Subcutaneous TID WC  . insulin glargine  20 Units Subcutaneous QHS  . ketorolac  30 mg Intravenous 4 times per day   Or  . ketorolac  30 mg Intramuscular 4 times per day  . vancomycin  1,000 mg Intravenous Q8H   Assessment: Pt is a 45 year old female who presents with labial redness and swelling with some drainage. Pharmacy consulted to dose vancomycin for celulitis  Goal of Therapy:  Vancomycin trough level 10-15 mcg/ml  Plan:  Measure antibiotic drug levels at steady state Follow up culture results vancomycin 2g once then 1g q 12 hours. will draw trough at steady state 1/14 @ 0400. Pharmacy to continue to monitor. Thank you for the consult.   1/14 AM vanc level 6.  Changed to 1 gram q 8 hours. Level before 4th new dose.  Jacquel Mccamish S 05/31/2015,5:04 AM

## 2015-05-31 NOTE — Progress Notes (Signed)
Subjective: Patient reports CONSTIPATION. PAIN DECREASING.  Objective: I have reviewed patient's vital signs, intake and output and medications.  General: alert, cooperative and no distress vULVA WITH DECREASING INDURATION (5 X 3 CM)   Assessment/Plan: Lt Labia majora cellulitis - GBS Constipation  Vancomycin changed to q 8 hrs dosing. Dulcolax Daily prn    LOS: 3 days    Linda Peterson Hickle 05/31/2015, 10:32 AM

## 2015-06-01 DIAGNOSIS — R638 Other symptoms and signs concerning food and fluid intake: Secondary | ICD-10-CM | POA: Insufficient documentation

## 2015-06-01 LAB — GLUCOSE, CAPILLARY
GLUCOSE-CAPILLARY: 231 mg/dL — AB (ref 65–99)
GLUCOSE-CAPILLARY: 147 mg/dL — AB (ref 65–99)
GLUCOSE-CAPILLARY: 166 mg/dL — AB (ref 65–99)
Glucose-Capillary: 197 mg/dL — ABNORMAL HIGH (ref 65–99)

## 2015-06-01 LAB — VANCOMYCIN, TROUGH: Vancomycin Tr: 13 ug/mL (ref 10–20)

## 2015-06-01 MED ORDER — LACTATED RINGERS IV SOLN
INTRAVENOUS | Status: DC
  Administered 2015-06-01: 16:00:00 via INTRAVENOUS

## 2015-06-01 NOTE — Patient Instructions (Signed)
Patient is admitted to hospital for IV antibiotics and possible debridement in the operating room

## 2015-06-01 NOTE — Consult Note (Signed)
ANTIBIOTIC CONSULT NOTE - INITIAL  Pharmacy Consult for vancomycin Indication: cellulitis  No Known Allergies  Patient Measurements: Height: 5\' 5"  (165.1 cm) Weight: 221 lb 14.4 oz (100.653 kg) IBW/kg (Calculated) : 57 Adjusted Body Weight:   Vital Signs: Temp: 97.8 F (36.6 C) (01/14 2349) Temp Source: Oral (01/14 2349) BP: 109/76 mmHg (01/15 0439) Pulse Rate: 73 (01/15 0439) Intake/Output from previous day:   Intake/Output from this shift:    Labs: No results for input(s): WBC, HGB, PLT, LABCREA, CREATININE in the last 72 hours. Estimated Creatinine Clearance: 103.3 mL/min (by C-G formula based on Cr of 0.53).  Recent Labs  05/31/15 0411 06/01/15 0436  VANCOTROUGH 6* 13     Microbiology: Recent Results (from the past 720 hour(s))  Wound culture     Status: None   Collection Time: 05/26/15  8:29 PM  Result Value Ref Range Status   Specimen Description ABSCESS  Final   Special Requests Normal  Final   Gram Stain   Final    FEW WBC SEEN MODERATE GRAM POSITIVE COCCI IN CHAINS    Culture   Final    MODERATE GROWTH GROUP B STREP(S.AGALACTIAE)ISOLATED Virtually 100% of S. agalactiae (Group B) strains are susceptible to Penicillin.  For Penicillin-allergic patients, Erythromycin (85-95% sensitive) and Clindamycin (80% sensitive) are drugs of choice. Contact microbiology lab to request sensitivities if  needed within 7 days.    Report Status 05/29/2015 FINAL  Final  Culture, blood (routine x 2)     Status: None (Preliminary result)   Collection Time: 05/28/15  3:03 PM  Result Value Ref Range Status   Specimen Description BLOOD RIGHT ASSIST CONTROL  Final   Special Requests   Final    BOTTLES DRAWN AEROBIC AND ANAEROBIC AERO 8CC ANA 2CC   Culture NO GROWTH 3 DAYS  Final   Report Status PENDING  Incomplete  Culture, blood (routine x 2)     Status: None (Preliminary result)   Collection Time: 05/28/15  3:07 PM  Result Value Ref Range Status   Specimen Description  BLOOD LEFT ASSIST CONTROL  Final   Special Requests   Final    BOTTLES DRAWN AEROBIC AND ANAEROBIC  AERO [place] ANA [place]   Culture NO GROWTH 3 DAYS  Final   Report Status PENDING  Incomplete    Medical History: Past Medical History  Diagnosis Date  . Skin cancer of face     Medications:  Scheduled:  . docusate sodium  100 mg Oral BID  . insulin aspart  0-15 Units Subcutaneous TID WC  . insulin aspart  0-5 Units Subcutaneous QHS  . insulin aspart  5 Units Subcutaneous TID WC  . insulin glargine  20 Units Subcutaneous QHS  . ketorolac  30 mg Intravenous 4 times per day   Or  . ketorolac  30 mg Intramuscular 4 times per day  . vancomycin  1,000 mg Intravenous Q8H   Assessment: Pt is a 45 year old female who presents with labial redness and swelling with some drainage. Pharmacy consulted to dose vancomycin for celulitis  Goal of Therapy:  Vancomycin trough level 10-15 mcg/ml  Plan:  Measure antibiotic drug levels at steady state Follow up culture results vancomycin 2g once then 1g q 12 hours. will draw trough at steady state 1/14 @ 0400. Pharmacy to continue to monitor. Thank you for the consult.   1/14 AM vanc level 6. Changed to 1 gram q 8 hours. Level before 4th new dose.  1/15 AM  vanc level 13. At goal, continue current regimen.  Shalandra Leu S 06/01/2015,6:02 AM

## 2015-06-01 NOTE — Progress Notes (Signed)
GYN ENCOUNTER NOTE  Subjective:       Linda Peterson is a 45 y.o. [phone] female is here for gynecologic evaluation of the following issues:  1. Follow-up 1.  Labial abscess status post I&D in urgent care clinic.  Patient has had increasing pain in the vulva and left gluteus; temperature at home, has been as high as 101.27F.  She is on Septra DS for MRSA coverage.     Gynecologic History No LMP recorded. Patient has had a hysterectomy. Contraception: Hysterectomy  Obstetric History OB History  Gravida Para Term Preterm AB SAB TAB Ectopic Multiple Living  4 2 2  1  1   3     # Outcome Date GA Lbr Len/2nd Weight Sex Delivery Anes PTL Lv  4 Term 1995   8 lb 8 oz (3.856 kg) M Vag-Spont   Y  3 Term 1992   6 lb 6.4 oz (2.903 kg) F Vag-Spont   Y  2 Gravida 1989   8 lb 1.6 oz (3.674 kg) F Vag-Spont   Y  1 TAB 1987              Past Medical History  Diagnosis Date  . Skin cancer of face     Past Surgical History  Procedure Laterality Date  . Appendectomy    . Skin biopsy      face  . Skin cancer excision    . Abdominal hysterectomy      No current facility-administered medications on file prior to visit.   Current Outpatient Prescriptions on File Prior to Visit  Medication Sig Dispense Refill  . ibuprofen (ADVIL,MOTRIN) 200 MG tablet Take 200 mg by mouth every 6 (six) hours as needed.    . ondansetron (ZOFRAN ODT) 4 MG disintegrating tablet Take 1 tablet (4 mg total) by mouth every 8 (eight) hours as needed for nausea or vomiting. 15 tablet 0  . oxyCODONE-acetaminophen (ROXICET) 5-325 MG tablet Take 1 tablet by mouth every 8 (eight) hours as needed for moderate pain or severe pain (Do not drive or operate heavy machinery while taking as can cause drowsiness.). 9 tablet 0  . sulfamethoxazole-trimethoprim (BACTRIM DS,SEPTRA DS) 800-160 MG tablet Take 2 tablets by mouth 2 (two) times daily. 40 tablet 0    No Known Allergies  Social History   Social History  . Marital Status:  Married    Spouse Name: N/A  . Number of Children: N/A  . Years of Education: N/A   Occupational History  . Not on file.   Social History Main Topics  . Smoking status: Never Smoker   . Smokeless tobacco: Not on file  . Alcohol Use: Yes     Comment: occas  . Drug Use: No  . Sexual Activity: Not Currently   Other Topics Concern  . Not on file   Social History Narrative    Family History  Problem Relation Age of Onset  . Diabetes Father   . Colon cancer Maternal Grandmother   . Ovarian cancer Paternal Grandmother   . Diabetes Paternal Grandmother   . Breast cancer Paternal Grandfather     The following portions of the patient's history were reviewed and updated as appropriate: allergies, current medications, past family history, past medical history, past social history, past surgical history and problem list.  Review of Systems Review of Systems - General ROS: negative for - , hot flashes, malaise or night sweats.  POSITIVE-chills, fatigue, fever Hematological and Lymphatic ROS: negative for - bleeding problems  or swollen lymph nodes Gastrointestinal ROS: negative for - abdominal pain, blood in stools, change in bowel habits and nausea/vomiting Musculoskeletal ROS: negative for - joint pain, muscle pain or muscular weakness Genito-Urinary ROS: negative for - change in menstrual cycle, dysmenorrhea, dyspareunia, dysuria, genital discharge, genital ulcers, hematuria, incontinence, irregular/heavy menses, nocturia or pelvic pain  Objective:   BP 163/93 mmHg  Pulse 116  Ht 5\' 5"  (1.651 m)  Wt 221 lb 14.4 oz (100.653 kg)  BMI 36.93 kg/m2 CONSTITUTIONAL: Well-developed, well-nourished female in In significant discomfort with inability to sit on exam table (patient lying on side) HENT:  Normocephalic, atraumatic.  NECK: Normal range of motion, supple, no masses.  Normal thyroid.  SKIN: Skin is warm and dry. No rash noted. Not diaphoretic. No erythema. No pallor. [place]:  Alert and oriented to person, place, and time. PSYCHIATRIC: Normal mood and affect. Normal behavior. Normal judgment and thought content. CARDIOVASCULAR:Not Examined RESPIRATORY: Not Examined BREASTS: Not Examined ABDOMEN: Soft, non distended; Non tender.  No Organomegaly. PELVIC:  External Genitalia: Markedly duration of left labia majora with swelling 3-4 times the size of the right labia majora; firm induration 6 x 4 cm; Nu Gauze with is removed from I&D site  BUS: Normal; No Bartholin's gland abscess  Vagina: Normal to Palpation  Cervix: Surgically absent  Uterus:Surgically absent  Adnexa: Normal  RV: Normal ; No perirectal abscess  Bladder: Nontender MUSCULOSKELETAL: Normal range of motion. No tenderness.  No cyanosis, clubbing, or edema.     Assessment:   Left labia majora abscess/cellulitis, status post I&D. Left gluteus cellulitis. Persistent fevers   Plan:   1.  Incision and drainage  PROCEDURE NOTE: The perineum is prepped with Betadine.  10 cc of lidocaine 1% without epinephrine is used to infiltrate the left labia majora.  Scalpel was used to incise the labia.  Hemostat was used to break up loculations and release 5 cc of purulent drainage.  Procedure is terminated prematurely because of extreme patient discomfort.  2.  Admit to hospital for IV antibiotics and possible OR debridement.  Brayton Mars, MD  Note: This dictation was prepared with Dragon dictation along with smaller phrase technology. Any transcriptional errors that result from this process are unintentional.

## 2015-06-01 NOTE — Progress Notes (Signed)
Subjective: Patient reports less discomfort. Tolerating ADA diet with insulin coverage.  Objective: I have reviewed patient's vital signs, intake and output and medications.  General: alert, cooperative and no distress Extremities: extremities normal, atraumatic, no cyanosis or edema Lt Labia Majora induration 4 x 3 cm, 1/4 tender, firm with fluctuance; mild greenish drainage  CBG (last 3)   Recent Labs  05/31/15 2145 06/01/15 0833 06/01/15 1247  GLUCAP 219* 231* 147*      Assessment/Plan: GBS Vulvar Abcess/cellulitis slowly responding to IV Vancomycin. Targeted levels achieved per Pharmacy. DM - CBG improving.  Due to degree of induration/cellulitis and severity of infection at start of admission, with Vancomycin levels at target, will keep antibiotic on board to maximize tissue penetration. Possible switch to po coverage tomorrow as clinical improvement continues. NOT an OR debridement condition at this time. Endocrinolgy Consult to optimize DM managemnt and initiate f/u care.  LOS: 4 days    Linda Peterson 06/01/2015, 1:14 PM

## 2015-06-02 LAB — GLUCOSE, CAPILLARY
GLUCOSE-CAPILLARY: 130 mg/dL — AB (ref 65–99)
GLUCOSE-CAPILLARY: 179 mg/dL — AB (ref 65–99)
GLUCOSE-CAPILLARY: 186 mg/dL — AB (ref 65–99)
Glucose-Capillary: 221 mg/dL — ABNORMAL HIGH (ref 65–99)

## 2015-06-02 LAB — CREATININE, SERUM
Creatinine, Ser: 0.65 mg/dL (ref 0.44–1.00)
GFR calc Af Amer: >60 mL/min (ref >60)
GFR calc non Af Amer: >60 mL/min (ref >60)

## 2015-06-02 MED ORDER — VANCOMYCIN 50 MG/ML ORAL SOLUTION
125.0000 mg | Freq: Four times a day (QID) | ORAL | Status: DC
  Administered 2015-06-02 – 2015-06-03 (×4): 125 mg via ORAL
  Filled 2015-06-02 (×8): qty 2.5

## 2015-06-02 MED ORDER — BLOOD GLUCOSE METER KIT: PACK | Status: AC

## 2015-06-02 MED ORDER — IBUPROFEN 600 MG PO TABS
600.0000 mg | ORAL_TABLET | Freq: Four times a day (QID) | ORAL | Status: DC | PRN
  Administered 2015-06-02 – 2015-06-03 (×4): 600 mg via ORAL
  Filled 2015-06-02 (×4): qty 1

## 2015-06-02 MED ORDER — METFORMIN HCL 500 MG PO TABS
500.0000 mg | ORAL_TABLET | Freq: Two times a day (BID) | ORAL | Status: DC
  Administered 2015-06-02 – 2015-06-03 (×3): 500 mg via ORAL
  Filled 2015-06-02 (×3): qty 1

## 2015-06-02 NOTE — Consult Note (Signed)
Endocrine Initial Consult Note Date of Consult: 06/02/2015  Consulting Service: [facility] Endocrinology  Service Requesting Consult: Dr. Enzo Bi  SUBJECTIVE: Reason for Consultation: new onset diabetes   History of Present Illness: Linda Peterson is a 45 y.o. female with no significant medical history who was admitted for labial abscess.  Endocrinology has been consulted regarding new onset diabetes.  Fingerstick blood sugar was as high as the 300s during this admission.  HbA1c is 11.2.  She is currently being treated with a regimen of 20 units of Lantus once daily and 5 units of NovoLog insulin with meals 3 times a day.  She has no history of diabetes.  She does report a family history in her father and paternal aunt and grandmother.  She does not have a primary doctor and reports she is new to the area.  She endorses blurry vision polydipsia and polyuria recently.  She was drinking 2 L of Coke per day, sometimes more.  She is a former smoker.  She drinks alcohol on occasion.  She works for [employer].  Her shift is from 4 PM to 2:30 AM.   Her only complaint currently is puffiness and swelling in the upper and lower extremities she believes is due to IV fluids.  She has already learned to self administer insulin and monitor her blood glucose fingersticks.  She feels comfortable with this.  Patient Active Problem List   Diagnosis Date Noted  . Increased BMI 06/01/2015  . Left genital labial abscess 05/28/2015     Past Medical History  Diagnosis Date  . Skin cancer of face    Past Surgical History  Procedure Laterality Date  . Appendectomy    . Skin biopsy      face  . Skin cancer excision    . Abdominal hysterectomy     Family History  Problem Relation Age of Onset  . Diabetes Father   . Colon cancer Maternal Grandmother   . Ovarian cancer Paternal Grandmother   . Diabetes Paternal Grandmother   . Breast cancer Paternal Grandfather     Social History:  Social History   Substance Use Topics  . Smoking status: Never Smoker   . Smokeless tobacco: Not on file  . Alcohol Use: Yes     Comment: occas   No Known Allergies   Medications:  No current facility-administered medications on file prior to encounter.   Current Outpatient Prescriptions on File Prior to Encounter  Medication Sig Dispense Refill  . ibuprofen (ADVIL,MOTRIN) 200 MG tablet Take 200 mg by mouth every 6 (six) hours as needed.    . ondansetron (ZOFRAN ODT) 4 MG disintegrating tablet Take 1 tablet (4 mg total) by mouth every 8 (eight) hours as needed for nausea or vomiting. 15 tablet 0  . oxyCODONE-acetaminophen (ROXICET) 5-325 MG tablet Take 1 tablet by mouth every 8 (eight) hours as needed for moderate pain or severe pain (Do not drive or operate heavy machinery while taking as can cause drowsiness.). 9 tablet 0  . sulfamethoxazole-trimethoprim (BACTRIM DS,SEPTRA DS) 800-160 MG tablet Take 2 tablets by mouth 2 (two) times daily. 40 tablet 0    Review of Systems: Pertinent items noted in HPI and remainder of comprehensive ROS otherwise negative.  OBJECTIVE: Temp:  [97.7 F (36.5 C)-98.3 F (36.8 C)] 97.7 F (36.5 C) (01/16 1159) Pulse Rate:  [64-81] 74 (01/16 1159) Resp:  [16-20] 20 (01/16 1159) BP: (97-126)/(55-90) 125/79 mmHg (01/16 1159) SpO2:  [96 %-100 %] 98 % (01/16 0805)  Temp (24hrs), Avg:98 F (36.7 C), Min:97.7 F (36.5 C), Max:98.3 F (36.8 C)  Weight: 100.653 kg (221 lb 14.4 oz)  Physical Exam: Gen: no acute distress, well-nourished, well-appearing HEENT: [place]/AT, eyes anicteric, EOMI, mucous membranes moist, no oropharyngeal lesions Neck: no thyroid enlargement or nodules noted, no cervical lymphadenopathy CAD: regular rate, regular rhythm. No murmur rubs or gallops PULM: clear to ausculation, no wheezes, rhonchi or rales. GI: soft, non tender, non distended. EXT: no clubbing, cyanosis or edema, no foot lesions or ulcerations   Skin: warm, dry, no rash Neuro:  grossly non focal, normal DTRs, alert and oriented x 3  Labs:  BMP Latest Ref Rng 06/02/2015 05/28/2015  Glucose 65 - 99 mg/dL - 346(H)  BUN 6 - 20 mg/dL - 7  Creatinine 0.44 - 1.00 mg/dL 0.65 0.53  Sodium 135 - 145 mmol/L - 130(L)  Potassium 3.5 - 5.1 mmol/L - 3.6  Chloride 101 - 111 mmol/L - 101  CO2 22 - 32 mmol/L - 22  Calcium 8.9 - 10.3 mg/dL - 8.6(L)   CBC Latest Ref Rng 05/28/2015  WBC 3.6 - 11.0 K/uL 10.8  Hemoglobin 12.0 - 16.0 g/dL 13.0  Hematocrit 35.0 - 47.0 % 37.4  Platelets 150 - 440 K/uL 383   Blood glucose values reviewed in glucose accordion view  ASSESSMENT: 45 year old woman seen for new onset diabetes mellitus.  Most likely this is type 2 diabetes given the family history.  Glycemic control has improved significantly with the current insulin regimen.  It is possible she may be able to maintain glycemic control with a basal plus regimen of Lantus and metformin.  RECOMMENDATIONS:  Start metformin 500 mg twice daily Continue Lantus 20 units once daily Would discontinue NovoLog insulin upon discharge. Continue the current regimen while inpatient. Glucometer checks before meals and at bedtime 4 times daily Please provide a prescription for Lantus pens, pen needles, glucometer, test strips, and lancets She will follow-up with me within 1-2 weeks to review her blood glucose data from home.  We will adjust her regimen further at that time outpatient. Thank you for allowing me to participate in this patient's care.  Atha Starks, MD [facility] Endocrinology

## 2015-06-02 NOTE — Progress Notes (Signed)
Subjective: Patient reports daily improvement in pain. No Fever/Chills/Sweats  Objective: I have reviewed patient's vital signs, intake and output and medications.  BP 125/79 mmHg  Pulse 74  Temp(Src) 97.7 F (36.5 C) (Oral)  Resp 20  Ht 5\' 5"  (1.651 m)  Wt 221 lb 14.4 oz (100.653 kg)  BMI 36.93 kg/m2  SpO2 98% NAD Vulva with 4 x 2 cm induration, decreasing in size and tender 2/4. Gluteus cellulitis resolved.  CBG (last 3)   Recent Labs  06/01/15 2158 06/02/15 0851 06/02/15 1305  GLUCAP 166* 221* 130*    No insulin coverage needed last night.   Assessment/Plan: [place] Abcess/Cellulitis, improving DM, improving; appreciate Endocrinology Consult  Convert to PO Vancomycin. Probable D/C tomorrow.   LOS: 5 days    Alanda Slim Linda Peterson 06/02/2015, 1:11 PM

## 2015-06-03 ENCOUNTER — Other Ambulatory Visit: Payer: Self-pay

## 2015-06-03 DIAGNOSIS — E119 Type 2 diabetes mellitus without complications: Secondary | ICD-10-CM

## 2015-06-03 LAB — CULTURE, BLOOD (ROUTINE X 2)
CULTURE: NO GROWTH
CULTURE: NO GROWTH

## 2015-06-03 LAB — GLUCOSE, CAPILLARY: GLUCOSE-CAPILLARY: 133 mg/dL — AB (ref 65–99)

## 2015-06-03 MED ORDER — INSULIN GLARGINE 100 UNIT/ML ~~LOC~~ SOLN: 20.0000 [IU] | Freq: Every day | SUBCUTANEOUS | Status: DC

## 2015-06-03 MED ORDER — OXYCODONE-ACETAMINOPHEN 5-325 MG PO TABS: 1.0000 | ORAL_TABLET | ORAL | Status: DC | PRN

## 2015-06-03 MED ORDER — METFORMIN HCL 500 MG PO TABS: 500.0000 mg | ORAL_TABLET | Freq: Two times a day (BID) | ORAL | Status: DC

## 2015-06-03 MED ORDER — VANCOMYCIN 50 MG/ML ORAL SOLUTION: 125.0000 mg | Freq: Four times a day (QID) | ORAL | Status: DC

## 2015-06-03 MED ORDER — FLUCONAZOLE 150 MG PO TABS: 150.0000 mg | ORAL_TABLET | Freq: Every day | ORAL | Status: DC

## 2015-06-03 MED ORDER — AMOXICILLIN 500 MG PO CAPS: 500.0000 mg | ORAL_CAPSULE | Freq: Three times a day (TID) | ORAL | Status: DC

## 2015-06-03 MED ORDER — IBUPROFEN 600 MG PO TABS: 600.0000 mg | ORAL_TABLET | Freq: Four times a day (QID) | ORAL | Status: DC | PRN

## 2015-06-03 NOTE — Progress Notes (Signed)
SUBJECTIVE: Reason for Consultation: new onset diabetes   History of Present Illness: Linda Peterson is a 45 y.o. female with no significant medical history who was admitted for labial abscess. Endocrinology has been consulted regarding new onset diabetes.  She feels well today.  Glycemic control has improved.  She may go home today.  She has received insulin education and is able to administer herself.  Scheduled Meds: . docusate sodium  100 mg Oral BID  . insulin aspart  0-15 Units Subcutaneous TID WC  . insulin aspart  0-5 Units Subcutaneous QHS  . insulin aspart  5 Units Subcutaneous TID WC  . insulin glargine  20 Units Subcutaneous QHS  . metFORMIN  500 mg Oral BID WC  . vancomycin  125 mg Oral 4 times per day   Continuous Infusions: . lactated ringers Stopped (06/02/15 1458)   PRN Meds:.benzocaine-Menthol, bisacodyl, ibuprofen, oxyCODONE-acetaminophen  Filed Vitals:   06/03/15 0752 06/03/15 1140  BP: 103/61   Pulse: 70   Temp: 98 F (36.7 C) 98.3 F (36.8 C)  Resp: 20    In general she is well-appearing, ambulating in the room without issues HEENT eyes anicteric his membranes moist Neck supple Pulm breathing unlabored on room air Extremity no edema Psych normal affect, good insight into her medical condition  ASSESSMENT: New-onset type 2 DM  RECOMMENDATIONS:  Continue metformin 500 mg twice daily upon discharge Continue Lantus 20 units once daily.   Would discontinue NovoLog insulin upon discharge.   Please provide a prescription for Lantus pens, pen needles, glucometer, test strips, and lancets She will follow-up with me next week outpatient. Thank you for allowing me to participate in this patient's care.  Atha Starks, MD [facility] Endocrinology

## 2015-06-03 NOTE — Progress Notes (Signed)
Patient understands all discharge instructions and the need to make follow up appointments. Patient discharge via wheelchair with auxillary. 

## 2015-06-03 NOTE — Progress Notes (Signed)
Inpatient Diabetes Program Recommendations  AACE/ADA: New Consensus Statement on Inpatient Glycemic Control (2015)  Target Ranges:  Prepandial:   less than 140 mg/dL      Peak postprandial:   less than 180 mg/dL (1-2 hours)      Critically ill patients:  140 - 180 mg/dL   Met with patient to educate her on the use of the insulin pen.  She had previously been educated by her RN Anderson Malta and had the basic concepts.  She was able to give me a return demo using the insulin pen without any difficulty.  Reviewed site selection and rotation, insulin needles and disposal, pen storage and insulin expiration.  Reviewed the long term complications of uncontrolled blood sugars. Encouraged to begin exercise at home to aid in blood sugar management.  Insulin teaching kit left with the patient.   Please order insulin pens and pen needles on discharge.   Gentry Fitz, RN, BA, MHA, CDE Diabetes Coordinator Inpatient Diabetes Program  [phone] (Team Pager) [phone] ([place]) 06/03/2015 10:05 AM

## 2015-06-03 NOTE — Discharge Summary (Signed)
Physician Discharge Summary  Patient ID: Linda Peterson MRN: [id number] DOB/AGE: January 31, 1971 45 y.o.  Admit date: 05/28/2015 Discharge date: 06/03/2015  Admission Diagnoses:Lt Labia Majora Abcess/Cellulitis  Discharge Diagnoses: Same and DM Active Problems:   Left genital labial abscess   Diabetes mellitus [facility])   Discharged Condition: good  Hospital Course: Uncomplicated. Treatment with IV Vancomycin of severe Vulvar and Gluteal Cellulitis and Abcess; converted to PO Vancomycin. Undiagnosed DM managed with Insulin coverage with gradual control/reduction of hyperglycemia; now converted to Metformin 500 mg twice daily and HS Lantus 20 units.  Consults: Endocrinology  Significant Diagnostic Studies: labs:  CBG (last 3)   Recent Labs  06/02/15 1712 06/02/15 2139 06/03/15 0808  GLUCAP 179* 186* 133*     and microbiology: wound culture: positive for GBS  Treatments: IV hydration, antibiotics: vancomycin, analgesia: acetaminophen w/ codeine and Morphine, insulin: regular and Lantus and Metformin   Discharge Exam: Blood pressure 103/61, pulse 70, temperature 98.3 F (36.8 C), temperature source Oral, resp. rate 20, height 5' 5"  (1.651 m), weight 221 lb 14.4 oz (100.653 kg), SpO2 100 %. Lt Labia Majora 4 x 2 cm induration, 1/4 tender  Disposition: 01-Home or Self Care      Discharge Instructions    Discharge patient    Complete by:  As directed             Medication List    STOP taking these medications        ondansetron 4 MG disintegrating tablet  Commonly known as:  ZOFRAN ODT     sulfamethoxazole-trimethoprim 800-160 MG tablet  Commonly known as:  BACTRIM DS,SEPTRA DS      TAKE these medications        blood glucose meter kit and supplies  Dispense based on patient and insurance preference. Use up to four times daily as directed. (FOR ICD-9 250.00, 250.01).     ibuprofen 600 MG tablet  Commonly known as:  ADVIL,MOTRIN  Take 1 tablet (600 mg total) by  mouth every 6 (six) hours as needed for fever or headache.     insulin glargine 100 UNIT/ML injection  Commonly known as:  LANTUS  Inject 0.2 mLs (20 Units total) into the skin at bedtime.     metFORMIN 500 MG tablet  Commonly known as:  GLUCOPHAGE  Take 1 tablet (500 mg total) by mouth 2 (two) times daily with a meal.     oxyCODONE-acetaminophen 5-325 MG tablet  Commonly known as:  PERCOCET/ROXICET  Take 1-2 tablets by mouth every 3 (three) hours as needed (moderate to severe pain (when tolerating fluids)).     vancomycin 50 mg/mL oral solution  Commonly known as:  VANCOCIN  Take 2.5 mLs (125 mg total) by mouth every 6 (six) hours.       Follow-up Information    Follow up with Brayton Mars, MD. Schedule an appointment as soon as possible for a visit in 1 week.   Specialties:  Obstetrics and Gynecology, Radiology   Why:  F/U on Labial Abcess/Cellulitis   Contact information:   [address] [place] [address] [phone]       Follow up with Stacie Glaze, MD. Schedule an appointment as soon as possible for a visit in 1 week.   Specialty:  Internal Medicine   Why:  DM f/u   Contact information:   [place] [address] [phone]       Signed: Alanda Slim [place] 06/03/2015,  12:52 PM

## 2015-06-04 LAB — GLUCOSE, CAPILLARY: Glucose-Capillary: 133 mg/dL — ABNORMAL HIGH (ref 65–99)

## 2015-06-10 ENCOUNTER — Encounter: Payer: Self-pay | Admitting: Obstetrics and Gynecology

## 2015-06-10 ENCOUNTER — Ambulatory Visit (INDEPENDENT_AMBULATORY_CARE_PROVIDER_SITE_OTHER): Payer: BLUE CROSS/BLUE SHIELD | Admitting: Obstetrics and Gynecology

## 2015-06-10 VITALS — BP 130/87 | HR 94 | Ht 65.0 in | Wt 221.5 lb

## 2015-06-10 DIAGNOSIS — E1169 Type 2 diabetes mellitus with other specified complication: Secondary | ICD-10-CM | POA: Diagnosis not present

## 2015-06-10 DIAGNOSIS — L0291 Cutaneous abscess, unspecified: Secondary | ICD-10-CM | POA: Diagnosis not present

## 2015-06-10 DIAGNOSIS — L0390 Cellulitis, unspecified: Secondary | ICD-10-CM

## 2015-06-10 DIAGNOSIS — N764 Abscess of vulva: Secondary | ICD-10-CM

## 2015-06-10 NOTE — Progress Notes (Signed)
Chief complaint: 1.  Vulvar abscess. 2.  Type 2 diabetes mellitus.  Patient presents for 1 week follow-up after discharge from hospital for 1 week hospitalization treating severe left labia majora abscess secondary to GBS-positive culture.  Course was complicated by undiagnosed diabetes mellitus requiring insulin therapy.  Patient states blood sugars are markedly improved with values running less than 200.  At this time.  She is on metformin 500 mg twice a day and has regular sliding scale insulin coverage. Patient reports that her left labia majora is minimally tender at this time and the firm induration previously noted has diminished significantly.  She does desire to return to work.  Past medical history, past surgical history, medications, allergies reviewed.  OBJECTIVE: BP 130/87 mmHg  Pulse 94  Ht 5\' 5"  (1.651 m)  Wt 221 lb 8 oz (100.472 kg)  BMI 36.86 kg/m2 Pleasant white female in no acute distress. Pelvic exam: External genitalia-left labia majora 1.3 times size of right labia majora; left labia majora induration is now 3 x 1 cm (decreased); nontender.  ASSESSMENT: 1.  Resolving left labia majora abscess/cellulitis. 2. Now completing first of 2 weeks of outpatient vancomycin therapy 3.  Diabetes mellitus, improvement with blood sugars on metformin 500 mg twice a day.  PLAN: 1.  Continue with vancomycin 125 mg every 8 hours for another week. 2.  Maintain follow-up with endocrinology as scheduled on 06/19/2015. 3.  Return in 4 weeks for annual exam.  Brayton Mars, MD  Note: This dictation was prepared with Dragon dictation along with smaller phrase technology. Any transcriptional errors that result from this process are unintentional.

## 2015-06-10 NOTE — Patient Instructions (Addendum)
1.  Complete course of antibiotics over the next 7 days.  h 2.  return to work without restrictions. 3.  Return in 4 weeks for annual physical.

## 2015-06-27 ENCOUNTER — Encounter: Payer: Self-pay | Admitting: *Deleted

## 2015-06-27 ENCOUNTER — Encounter: Payer: BLUE CROSS/BLUE SHIELD | Attending: Internal Medicine | Admitting: *Deleted

## 2015-06-27 VITALS — BP 136/84 | Ht 65.0 in | Wt 221.0 lb

## 2015-06-27 DIAGNOSIS — E119 Type 2 diabetes mellitus without complications: Secondary | ICD-10-CM | POA: Diagnosis present

## 2015-06-27 NOTE — Progress Notes (Signed)
Diabetes Self-Management Education  Visit Type: First/Initial  Appt. Start Time: 1330 Appt. End Time: [id number]  06/27/2015  Ms. Linda Peterson, identified by name and date of birth, is a 45 y.o. female with a diagnosis of Diabetes: Type 2.   ASSESSMENT  Blood pressure 136/84, height 5\' 5"  (1.651 m), weight 221 lb (100.245 kg). Body mass index is 36.78 kg/(m^2).      Diabetes Self-Management Education - 06/27/15 1514    Visit Information   Visit Type First/Initial   Initial Visit   Diabetes Type Type 2   Are you taking your medications as prescribed? Yes   Date Diagnosed January 2017   Health Coping   How would you rate your overall health? Good   Psychosocial Assessment   Patient Belief/Attitude about Diabetes Motivated to manage diabetes  "need to keep under control"   Self-care barriers None   Self-management support Doctor's office;Family   Patient Concerns Nutrition/Meal planning;Medication;Healthy Lifestyle;Problem Solving;Glycemic Control;Weight Control   Special Needs None   Preferred Learning Style Auditory;Visual;Hands on   [place] in progress   How often do you need to have someone help you when you read instructions, pamphlets, or other written materials from your doctor or pharmacy? 1 - Never   What is the last grade level you completed in school? 1 yr college   Complications   Last HgB A1C per patient/outside source 11.2 %  05/28/15   How often do you check your blood sugar? 1-2 times/day   Fasting Blood glucose range (mg/dL) 130-179 Pt reports FBG's range around 150's mg/dL.   Postprandial Blood glucose range (mg/dL) 130-179 Pt reports pre and post supper readings are less than 180's mg/dL.   Have you had a dilated eye exam in the past 12 months? No   Have you had a dental exam in the past 12 months? No   Are you checking your feet? Yes   How many days per week are you checking your feet? 4   Dietary Intake   Breakfast boiled egg wtih  crackers; bacon, lite and fit yogurt   Lunch grilled chicken with vegetables; salad; corn   Snack (afternoon) 4-5 prunes, peanuts, yogurt   Dinner pasta; heatlhy choice   Snack (evening) root chips, peanuts   Beverage(s) water, tea sweetened with Splenda   Exercise   Exercise Type Light (walking / raking leaves)   How many days per week to you exercise? 3   How many minutes per day do you exercise? 20   Total minutes per week of exercise 60   Patient Education   Previous Diabetes Education No   Disease state  Definition of diabetes, type 1 and 2, and the diagnosis of diabetes   Nutrition management  Role of diet in the treatment of diabetes and the relationship between the three main macronutrients and blood glucose level   Physical activity and exercise  Role of exercise on diabetes management, blood pressure control and cardiac health.   Medications Reviewed patients medication for diabetes, action, purpose, timing of dose and side effects.   Monitoring Purpose and frequency of SMBG.;Identified appropriate SMBG and/or A1C goals.   Chronic complications Relationship between chronic complications and blood glucose control   Psychosocial adjustment Identified and addressed patients feelings and concerns about diabetes   Individualized Goals (developed by patient)   Reducing Risk Improve blood sugars Decrease medications Prevent diabetes complications Lose weight Lead a healthier lifestyle Become more fit   Outcomes   Expected Outcomes Demonstrated  interest in learning. Expect positive outcomes      Individualized Plan for Diabetes Self-Management Training:   Learning Objective:  Patient will have a greater understanding of diabetes self-management. Patient education plan is to attend individual and/or group sessions per assessed needs and concerns.   Plan:   Patient Instructions  Check blood sugars 2 x day before breakfast and before/2 hrs after supper every day Exercise:  Continue walking  for 20  minutes 3 days a week and gradually increase to 30 minutes 5 x week Eat 3 meals day, 1-2  snacks a day Space meals 4-6 hours apart Make an eye doctor appointment Bring blood sugar records to the next appointment Return for appointment on:  Friday July 18, 2015 at 1:15 pm with Jaclyn Shaggy (dietitian)   Expected Outcomes:  Demonstrated interest in learning. Expect positive outcomes  Education material provided:  General Meal Planning Guidelines Simple Meal Plan  If problems or questions, patient to contact team via:   Johny Drilling, RN, [place], CDE [phone]  Future DSME appointment:  Due to patient's work schedule (she can only come to Friday appointments) she will be seen for 1:1 appointments with the nurse and dietitian. Her next appointment is Friday July 18, 2015 with dietitian.

## 2015-06-27 NOTE — Patient Instructions (Addendum)
Check blood sugars 2 x day before breakfast and before/2 hrs after supper every day Exercise: Continue walking  for 20  minutes 3 days a week and gradually increase to 30 minutes 5 x week Eat 3 meals day, 1-2  snacks a day Space meals 4-6 hours apart Make an eye doctor appointment Bring blood sugar records to the next appointment Return for appointment on:  Friday July 18, 2015 at 1:15 pm with Jaclyn Shaggy (dietitian)

## 2015-07-09 ENCOUNTER — Encounter: Payer: Self-pay | Admitting: Obstetrics and Gynecology

## 2015-07-09 ENCOUNTER — Ambulatory Visit (INDEPENDENT_AMBULATORY_CARE_PROVIDER_SITE_OTHER): Payer: BLUE CROSS/BLUE SHIELD | Admitting: Obstetrics and Gynecology

## 2015-07-09 VITALS — BP 132/85 | HR 86 | Ht 65.0 in | Wt 221.1 lb

## 2015-07-09 DIAGNOSIS — K625 Hemorrhage of anus and rectum: Secondary | ICD-10-CM | POA: Diagnosis not present

## 2015-07-09 DIAGNOSIS — Z1239 Encounter for other screening for malignant neoplasm of breast: Secondary | ICD-10-CM | POA: Diagnosis not present

## 2015-07-09 DIAGNOSIS — R638 Other symptoms and signs concerning food and fluid intake: Secondary | ICD-10-CM | POA: Diagnosis not present

## 2015-07-09 DIAGNOSIS — Z0000 Encounter for general adult medical examination without abnormal findings: Secondary | ICD-10-CM | POA: Diagnosis not present

## 2015-07-09 DIAGNOSIS — N764 Abscess of vulva: Secondary | ICD-10-CM | POA: Diagnosis not present

## 2015-07-09 DIAGNOSIS — E669 Obesity, unspecified: Secondary | ICD-10-CM

## 2015-07-09 DIAGNOSIS — E1169 Type 2 diabetes mellitus with other specified complication: Secondary | ICD-10-CM | POA: Diagnosis not present

## 2015-07-09 DIAGNOSIS — Z01419 Encounter for gynecological examination (general) (routine) without abnormal findings: Secondary | ICD-10-CM

## 2015-07-09 NOTE — Patient Instructions (Signed)
1. Pap smear is done today 2. Mammogram is ordered. 3. Referral to GI is given for history of rectal bleeding 4. Healthy eating with exercise and weight loss is encouraged 5. Return in 1 year for annual exam 6. Recommend calcium and vitamin D supplementation daily

## 2015-07-09 NOTE — Progress Notes (Signed)
GYN ANNUAL PREVENTATIVE CARE ENCOUNTER NOTE  Subjective:       Linda Peterson is a 45 y.o. [phone] female here for a routine annual gynecologic exam.  Current complaints: rectal bleeding.     Recent hospitalization for uncontrolled type 2 diabetes mellitus and vulvar abscess requiring vancomycin therapy. Patient has since completed her antibiotic course and is asymptomatic. Most recently over the past weekend she developed rectal bleeding, bright red, without pain. She does have history of hemorrhoids. No prior episodes of bleeding were noted. Gynecologic History No LMP recorded. Patient has had a hysterectomy. Contraception: status post hysterectomy Last Pap:2012-normal Last mammogram: 2012-normal  Obstetric History OB History  Gravida Para Term Preterm AB SAB TAB Ectopic Multiple Living  _0 # Outcome Date GA Lbr Len/2nd Weight Sex Delivery Anes PTL Lv  4 Term 1995   8 lb 8 oz (3.856 kg) M Vag-Spont   Y  3 Term 1992   6 lb 6.4 oz (2.903 kg) F Vag-Spont   Y  2 Gravida 1989   8 lb 1.6 oz (3.674 kg) F Vag-Spont   Y  1 TAB 1987              Past Medical History  Diagnosis Date  . Skin cancer of face   . Diabetes mellitus without complication [facility])     Past Surgical History  Procedure Laterality Date  . Appendectomy    . Skin biopsy      face  . Skin cancer excision    . Abdominal hysterectomy    . Incision and drainage abscess Left     genital    Current Outpatient Prescriptions on File Prior to Visit  Medication Sig Dispense Refill  . blood glucose meter kit and supplies Dispense based on patient and insurance preference. Use up to four times daily as directed. (FOR ICD-9 250.00, 250.01). 1 each 0  . ibuprofen (ADVIL,MOTRIN) 600 MG tablet Take 1 tablet (600 mg total) by mouth every 6 (six) hours as needed for fever or headache. 30 tablet 0  . metFORMIN (GLUCOPHAGE) 1000 MG tablet Take 1 tablet by mouth 2 (two) times daily.     No current  facility-administered medications on file prior to visit.    No Known Allergies  Social History   Social History  . Marital Status: Married    Spouse Name: N/A  . Number of Children: N/A  . Years of Education: N/A   Occupational History  . Not on file.   Social History Main Topics  . Smoking status: Former Smoker -- 0.50 packs/day for 20 years    Types: Cigarettes    Quit date: 08/16/2014  . Smokeless tobacco: Never Used  . Alcohol Use: No     Comment: occas  . Drug Use: No  . Sexual Activity: Not Currently   Other Topics Concern  . Not on file   Social History Narrative    Family History  Problem Relation Age of Onset  . Diabetes Father   . Colon cancer Maternal Grandmother   . Ovarian cancer Paternal Grandmother   . Diabetes Paternal Grandmother   . Breast cancer Paternal Grandfather     The following portions of the patient's history were reviewed and updated as appropriate: allergies, current medications, past family history, past medical history, past social history, past surgical history and problem list.  Review of Systems Review of Systems  Constitutional: Negative.   HENT:  Negative.   Respiratory: Negative.   Cardiovascular: Negative.   Gastrointestinal: Positive for constipation and blood in stool. Negative for abdominal pain, diarrhea and melena.  Genitourinary: Negative.   Musculoskeletal: Negative.   Skin: Negative.   Neurological: Negative.   Psychiatric/Behavioral: Negative.      Objective:   BP 132/85 mmHg  Pulse 86  Ht _0  (1.651 m)  Wt 221 lb 2 oz (100.302 kg)  BMI 36.80 kg/m2 Physical Exam  Constitutional: She is oriented to person, place, and time. She appears well-developed and well-nourished.  HENT:  Head: Atraumatic.  Eyes: Conjunctivae and EOM are normal.  Neck: Normal range of motion. Neck supple. No thyromegaly present.  Cardiovascular: Normal rate, regular rhythm and normal heart sounds.   No murmur  heard. Pulmonary/Chest: Effort normal and breath sounds normal.  Abdominal: Soft. Bowel sounds are normal. She exhibits no mass. No hernia.  Genitourinary:  External genitalia within normal; previously noted induration is resolved left labia majora; incision site is well-healed; no tenderness BUS within normal Vagina-good estrogen effect; no discharge Cervix-surgically absent Uterus-surgically absent Adnexa-nonpalpable and nontender Rectovaginal-hemorrhoid 7:00, nonthrombosed, 5 mm vesicle/papule on hemorrhoid; no rectal masses; normal sphincter tone; no gross blood  Musculoskeletal: Normal range of motion. She exhibits no edema or tenderness.  Lymphadenopathy:    She has no cervical adenopathy.  Neurological: She is alert and oriented to person, place, and time.  Psychiatric: She has a normal mood and affect. Her behavior is normal.    Assessment:   Annual gynecologic examination 45 y.o. Contraception: status post hysterectomy Obesity 1 Status post vulvar abscess treatment with IV antibiotics; normal exam today Adult-onset diabetes mellitus, improved, managed by endocrine Rectal bleeding, new onset; external hemorrhoid on exam  Plan:  Pap: Pap, Reflex if ASCUS Mammogram: Ordered Labs: her internal medicine Routine preventative health maintenance measures emphasized: Diet/Weight control, Tobacco Cessation and Alcohol/Drug use GI referral for rectal bleeding Return to [place], MD   Note: This dictation was prepared with Dragon dictation along with smaller phrase technology. Any transcriptional errors that result from this process are unintentional.

## 2015-07-09 NOTE — Addendum Note (Signed)
Addended by: Elouise Munroe on: 07/09/2015 03:06 PM   Modules accepted: Orders

## 2015-07-09 NOTE — Addendum Note (Signed)
Addended by: Elouise Munroe on: 07/09/2015 02:42 PM   Modules accepted: Orders

## 2015-07-16 LAB — PAP IG AND HPV HIGH-RISK
HPV, high-risk: NEGATIVE
PAP Smear Comment: 0

## 2015-07-18 ENCOUNTER — Ambulatory Visit: Payer: BLUE CROSS/BLUE SHIELD | Admitting: Dietician

## 2015-08-08 ENCOUNTER — Ambulatory Visit: Payer: BLUE CROSS/BLUE SHIELD | Admitting: Dietician

## 2015-09-05 ENCOUNTER — Ambulatory Visit: Payer: BLUE CROSS/BLUE SHIELD | Admitting: Dietician

## 2015-09-19 ENCOUNTER — Encounter: Payer: Self-pay | Admitting: *Deleted

## 2016-03-13 ENCOUNTER — Ambulatory Visit
Admission: EM | Admit: 2016-03-13 | Discharge: 2016-03-13 | Disposition: A | Discharge status: Home / Self Care | Payer: BLUE CROSS/BLUE SHIELD | Attending: Family Medicine | Admitting: Family Medicine

## 2016-03-13 ENCOUNTER — Encounter: Payer: Self-pay | Admitting: Gynecology

## 2016-03-13 ENCOUNTER — Ambulatory Visit (INDEPENDENT_AMBULATORY_CARE_PROVIDER_SITE_OTHER): Payer: BLUE CROSS/BLUE SHIELD

## 2016-03-13 DIAGNOSIS — H6593 Unspecified nonsuppurative otitis media, bilateral: Secondary | ICD-10-CM | POA: Diagnosis not present

## 2016-03-13 DIAGNOSIS — J40 Bronchitis, not specified as acute or chronic: Secondary | ICD-10-CM

## 2016-03-13 DIAGNOSIS — J0101 Acute recurrent maxillary sinusitis: Secondary | ICD-10-CM | POA: Diagnosis not present

## 2016-03-13 DIAGNOSIS — J029 Acute pharyngitis, unspecified: Secondary | ICD-10-CM | POA: Diagnosis not present

## 2016-03-13 LAB — RAPID STREP SCREEN (MED CTR MEBANE ONLY): STREPTOCOCCUS, GROUP A SCREEN (DIRECT): NEGATIVE

## 2016-03-13 MED ORDER — KETOROLAC TROMETHAMINE 60 MG/2ML IM SOLN
60.0000 mg | Freq: Once | INTRAMUSCULAR | Status: AC
  Administered 2016-03-13: 60 mg via INTRAMUSCULAR

## 2016-03-13 MED ORDER — IPRATROPIUM-ALBUTEROL 0.5-2.5 (3) MG/3ML IN SOLN
3.0000 mL | Freq: Once | RESPIRATORY_TRACT | Status: AC
  Administered 2016-03-13: 3 mL via RESPIRATORY_TRACT

## 2016-03-13 MED ORDER — PSEUDOEPHEDRINE-CODEINE-GG 30-10-100 MG/5ML PO SOLN: 10.0000 mL | Freq: Every evening | ORAL | 0 refills | Status: DC | PRN

## 2016-03-13 MED ORDER — ALBUTEROL SULFATE HFA 108 (90 BASE) MCG/ACT IN AERS: 1.0000 | INHALATION_SPRAY | Freq: Four times a day (QID) | RESPIRATORY_TRACT | 0 refills | Status: AC | PRN

## 2016-03-13 MED ORDER — FLUTICASONE PROPIONATE 50 MCG/ACT NA SUSP: 1.0000 | Freq: Two times a day (BID) | NASAL | 0 refills | Status: AC

## 2016-03-13 MED ORDER — ACETAMINOPHEN 500 MG PO TABS: 1000.0000 mg | ORAL_TABLET | Freq: Four times a day (QID) | ORAL | 0 refills | Status: AC | PRN

## 2016-03-13 MED ORDER — BENZONATATE 100 MG PO CAPS: 100.0000 mg | ORAL_CAPSULE | Freq: Three times a day (TID) | ORAL | 0 refills | Status: AC

## 2016-03-13 MED ORDER — HYDROCOD POLST-CPM POLST ER 10-8 MG/5ML PO SUER: 5.0000 mL | Freq: Two times a day (BID) | ORAL | 0 refills | Status: DC | PRN

## 2016-03-13 MED ORDER — SALINE SPRAY 0.65 % NA SOLN: 2.0000 | NASAL | 0 refills | Status: AC

## 2016-03-13 MED ORDER — AMOXICILLIN-POT CLAVULANATE 875-125 MG PO TABS: 1.0000 | ORAL_TABLET | Freq: Two times a day (BID) | ORAL | 0 refills | Status: AC

## 2016-03-13 NOTE — ED Notes (Signed)
Patient brought her prescription back apparently pharmacy did not have the pseudoephedrine codeine [place]. She was given a new prescription for Tussionex 1 teaspoon once twice a day when necessary for cough   Frederich Cha, MD 03/13/16 [phone]

## 2016-03-13 NOTE — ED Triage Notes (Signed)
Patient c/o x over 1 week cough / nasal congestion / sore throat. Per patient when blowing her nose causes her back pain.

## 2016-03-13 NOTE — ED Provider Notes (Signed)
CSN: [id number]     Arrival date & time 03/13/16  1008 History   First MD Initiated Contact with Patient 03/13/16 1132     Chief Complaint  Patient presents with  . Cough  . Sore Throat   (Consider location/radiation/quality/duration/timing/severity/associated sxs/prior Treatment) Married caucasian female here for evaluation right back pain, productive cough, sweats/chills, rhinitis x 1 week. Didn't check her temperature but thinks she had fever last night.  Others sick at work. Unable to work due to pain today.  Last time it felt like this was pneumonia.  Works at [employer] in [place].  Tried tylenol and advil without relief.  Pain has her crying.  Was supposed to work Saturday and Sunday.  Established patient      Past Medical History:  Diagnosis Date  . Diabetes mellitus without complication ([place])   . Skin cancer of face    Past Surgical History:  Procedure Laterality Date  . ABDOMINAL HYSTERECTOMY    . APPENDECTOMY    . INCISION AND DRAINAGE ABSCESS Left    genital  . SKIN BIOPSY     face  . SKIN CANCER EXCISION     Family History  Problem Relation Age of Onset  . Diabetes Father   . Ovarian cancer Paternal Grandmother   . Diabetes Paternal Grandmother   . Colon cancer Maternal Grandmother   . Breast cancer Paternal Grandfather    Social History  Substance Use Topics  . Smoking status: Former Smoker    Packs/day: 0.50    Years: 20.00    Types: Cigarettes    Quit date: 08/16/2014  . Smokeless tobacco: Never Used  . Alcohol use No     Comment: occas   OB History    Gravida Para Term Preterm AB Living   4 2 2   1 3    SAB TAB Ectopic Multiple Live Births     1     3     Review of Systems  Constitutional: Negative for activity change, appetite change, chills, diaphoresis, fatigue, fever and unexpected weight change.  HENT: Positive for congestion, postnasal drip, rhinorrhea, sinus pressure and sore throat. Negative for dental problem, drooling, ear  discharge, ear pain, facial swelling, hearing loss, mouth sores, nosebleeds, sneezing, tinnitus, trouble swallowing and voice change.   Eyes: Negative for photophobia, pain, discharge, redness, itching and visual disturbance.  Respiratory: Positive for cough and wheezing. Negative for choking, chest tightness, shortness of breath and stridor.   Cardiovascular: Negative for chest pain, palpitations and leg swelling.  Gastrointestinal: Negative for abdominal distention, abdominal pain, blood in stool, constipation, diarrhea, nausea and vomiting.  Endocrine: Negative for cold intolerance and heat intolerance.  Genitourinary: Negative for difficulty urinating, dysuria and hematuria.  Musculoskeletal: Negative for arthralgias, back pain, gait problem, joint swelling, myalgias, neck pain and neck stiffness.  Skin: Negative for color change, pallor, rash and wound.  Allergic/Immunologic: Positive for environmental allergies. Negative for food allergies.  Neurological: Positive for headaches. Negative for dizziness, tremors, seizures, syncope, facial asymmetry, speech difficulty, weakness, light-headedness and numbness.  Hematological: Negative for adenopathy. Does not bruise/bleed easily.  Psychiatric/Behavioral: Positive for sleep disturbance. Negative for agitation, behavioral problems and confusion.    Allergies  Review of patient's allergies indicates no known allergies.  Home Medications   Prior to Admission medications   Medication Sig Start Date End Date Taking? Authorizing Provider  acetaminophen (TYLENOL) 500 MG tablet Take 2 tablets (1,000 mg total) by mouth every 6 (six) hours as needed. 03/13/16  03/18/16  Olen Cordial, NP  albuterol (PROVENTIL HFA;VENTOLIN HFA) 108 (90 Base) MCG/ACT inhaler Inhale 1-2 puffs into the lungs every 6 (six) hours as needed for wheezing or shortness of breath. 03/13/16   Olen Cordial, NP  amoxicillin-clavulanate (AUGMENTIN) 875-125 MG tablet Take 1  tablet by mouth every 12 (twelve) hours. 03/13/16   Olen Cordial, NP  benzonatate (TESSALON) 100 MG capsule Take 1 capsule (100 mg total) by mouth every 8 (eight) hours. 03/13/16   Olen Cordial, NP  blood glucose meter kit and supplies Dispense based on patient and insurance preference. Use up to four times daily as directed. (FOR ICD-9 250.00, 250.01). 06/02/15   Alanda Slim Defrancesco, MD  fluticasone (FLONASE) 50 MCG/ACT nasal spray Place 1 spray into both nostrils 2 (two) times daily. 03/13/16   Olen Cordial, NP  metFORMIN (GLUCOPHAGE) 1000 MG tablet Take 1 tablet by mouth 2 (two) times daily. 06/18/15   Historical Provider, MD  pseudoephedrine-codeine-guaifenesin (MYTUSSIN DAC) 30-10-100 MG/5ML solution Take 10 mLs by mouth at bedtime as needed for cough. 03/13/16   Olen Cordial, NP  sodium chloride (OCEAN) 0.65 % SOLN nasal spray Place 2 sprays into both nostrils every 2 (two) hours while awake. 03/13/16 04/12/16  Olen Cordial, NP   Meds Ordered and Administered this Visit   Medications  ketorolac (TORADOL) injection 60 mg (60 mg Intramuscular Given 03/13/16 1146)  ipratropium-albuterol (DUONEB) 0.5-2.5 (3) MG/3ML nebulizer solution 3 mL (3 mLs Nebulization Given 03/13/16 1148)    BP (!) 139/97 (BP Location: Left Arm)   Pulse 83   Temp 97.6 F (36.4 C) (Oral)   Resp 18   Ht 5' 5"  (1.651 m)   Wt 220 lb (99.8 kg)   SpO2 98%   BMI 36.61 kg/m  No data found.   Physical Exam  Constitutional: She is oriented to person, place, and time. She appears well-developed and well-nourished. She is active and cooperative.  Non-toxic appearance. She does not have a sickly appearance. She appears ill. She appears distressed.  HENT:  Head: Normocephalic and atraumatic.  Right Ear: Hearing, external ear and ear canal normal. A middle ear effusion is present.  Left Ear: Hearing, external ear and ear canal normal. A middle ear effusion is present.  Nose: Mucosal edema and  rhinorrhea present. No nose lacerations, sinus tenderness, nasal deformity, septal deviation or nasal septal hematoma. No epistaxis.  No foreign bodies. Right sinus exhibits maxillary sinus tenderness and frontal sinus tenderness. Left sinus exhibits maxillary sinus tenderness and frontal sinus tenderness.  Mouth/Throat: Uvula is midline. Mucous membranes are not pale, dry and not cyanotic. She does not have dentures. No oral lesions. No trismus in the jaw. Normal dentition. No dental abscesses, uvula swelling, lacerations or dental caries. Posterior oropharyngeal edema and posterior oropharyngeal erythema present. No oropharyngeal exudate or tonsillar abscesses. Tonsils are 0 on the right. Tonsils are 0 on the left. No tonsillar exudate.  Patient wearing sweatshirt/long sleeve shirt/pants sweating/crying; copious rhinitis blowing nose almost used 1 entire box tissues yellow/green thin mucous; nasal congestion turbinates edema/erythema; maxillary greater than frontal tenderness; bilateral TMs with air fluid level clear; cobblestoning posterior pharynx; dry mucous membranes oral "sticky" no tongue coating; bilateral allergic shiners  Eyes: Conjunctivae, EOM and lids are normal. Pupils are equal, round, and reactive to light. Right eye exhibits no chemosis, no discharge, no exudate and no hordeolum. No foreign body present in the right eye. Left eye exhibits no chemosis, no discharge, no exudate  and no hordeolum. No foreign body present in the left eye. Right conjunctiva is not injected. Right conjunctiva has no hemorrhage. Left conjunctiva is not injected. Left conjunctiva has no hemorrhage. No scleral icterus. Right eye exhibits normal extraocular motion and no nystagmus. Left eye exhibits normal extraocular motion and no nystagmus. Right pupil is round and reactive. Left pupil is round and reactive. Pupils are equal.  Neck: Trachea normal, normal range of motion and phonation normal. Neck supple. No tracheal  tenderness, no spinous process tenderness and no muscular tenderness present. No neck rigidity. No tracheal deviation, no edema, no erythema and normal range of motion present. No thyroid mass and no thyromegaly present.  Cardiovascular: Normal rate, regular rhythm, S1 normal, S2 normal, normal heart sounds, intact distal pulses and normal pulses.  PMI is not displaced.  Exam reveals no gallop and no friction rub.   No murmur heard. Pulses:      Radial pulses are 2+ on the right side, and 2+ on the left side.  Pulmonary/Chest: Effort normal. No accessory muscle usage or stridor. No respiratory distress. She has no decreased breath sounds. She has wheezes in the left upper field and the left middle field. She has no rhonchi. She has no rales. She exhibits no tenderness.  egophany negative right fields; positive LLF/LMF  Abdominal: Soft. Normal appearance and bowel sounds are normal. She exhibits no distension and no mass. There is no hepatosplenomegaly, splenomegaly or hepatomegaly. There is no tenderness. There is no rigidity, no rebound, no guarding, no CVA tenderness, no tenderness at McBurney's point and negative Murphy's sign. No hernia. Hernia confirmed negative in the ventral area.  Musculoskeletal: Normal range of motion. She exhibits no edema, tenderness or deformity.       Right shoulder: Normal.       Left shoulder: Normal.       Right hip: Normal.       Left hip: Normal.       Right knee: Normal.       Left knee: Normal.       Cervical back: Normal.       Thoracic back: She exhibits pain. She exhibits normal range of motion, no tenderness, no bony tenderness, no swelling, no edema, no deformity, no laceration, no spasm and normal pulse.       Lumbar back: She exhibits pain. She exhibits normal range of motion, no tenderness, no bony tenderness, no swelling, no edema, no deformity, no laceration, no spasm and normal pulse.       Back:       Right hand: Normal.       Left hand: Normal.    Lymphadenopathy:       Head (right side): No submental, no submandibular, no tonsillar, no preauricular, no posterior auricular and no occipital adenopathy present.       Head (left side): No submental, no submandibular, no tonsillar, no preauricular, no posterior auricular and no occipital adenopathy present.    She has no cervical adenopathy.       Right cervical: No superficial cervical, no deep cervical and no posterior cervical adenopathy present.      Left cervical: No superficial cervical, no deep cervical and no posterior cervical adenopathy present.  Neurological: She is alert and oriented to person, place, and time. She has normal strength. She is not disoriented. She displays no atrophy and no tremor. No cranial nerve deficit or sensory deficit. She exhibits normal muscle tone. She displays no seizure activity. Coordination and  gait normal. GCS eye subscore is 4. GCS verbal subscore is 5. GCS motor subscore is 6.  Skin: Skin is warm and intact. Capillary refill takes less than 2 seconds. No abrasion, no bruising, no burn, no ecchymosis, no laceration, no lesion, no petechiae and no rash noted. She is diaphoretic. No cyanosis or erythema. No pallor. Nails show no clubbing.  Psychiatric: She has a normal mood and affect. Her speech is normal and behavior is normal. Judgment and thought content normal. She is not actively hallucinating. Cognition and memory are normal. She is attentive.  Nursing note and vitals reviewed.   Urgent Care Course   Clinical Course    Procedures (including critical care time)  Labs Review Labs Reviewed  RAPID STREP SCREEN (NOT AT [facility])  CULTURE, GROUP A STREP [facility])    Imaging Review Dg Chest 2 View  Result Date: 03/13/2016 CLINICAL DATA:  Cough and wheezing for 1 week EXAM: CHEST  2 VIEW COMPARISON:  None. FINDINGS: Normal heart size. Lungs clear. No pneumothorax. No pleural effusion. IMPRESSION: No active cardiopulmonary disease. Electronically  Signed   By: Marybelle Killings M.D.   On: 03/13/2016 12:15     Checked [place] Controlled Substances website and patient received Oxycodone acetaminophen 5/3568m x 2 Rx in January short course s/p hospitalization for abscess.  Toradol 657mIM administered by CMA CaVanessa Durhamt 1146 and duoneb 68m28mt 1148.  Chest xray pending.  Patient sipping ice water without difficulty lying on exam table.  1200 rapid strep negative patient notified will call with throat culture results once available typically 48 hours.  Patient verbalized understanding information/instructions, agreed with plan of care.  1236 patient notified chest xray negative pneumonia/fluid in lungs given copy.  Improved airflow lower airfields still inspiratory wheeze noted.  Pain improved almost completely resolved. Tolerated 1 glass ice water resting without crying now.  Patient verbalized understanding information/instructions, agreed with plan of care and had no further questions at this time.  Vitals:   03/13/16 1125 03/13/16 1127 03/13/16 1237  BP: (!) 139/97  (!) 141/78  Pulse: 83  92  Resp: 18    Temp: 97.6 F (36.4 C)    TempSrc: Oral    SpO2: 98%    Weight:  220 lb (99.8 kg)   Height:  5' 5"  (1.651 m)    discharged ambulatory in NAD VSS at 1240   MDM   1. Acute pharyngitis, unspecified etiology   2. Acute recurrent maxillary sinusitis   3. Otitis media with effusion, bilateral   4. Bronchitis    Patient may use normal saline nasal spray as needed.  Consider antihistamine claritin or zyrtec 89m34m daily or nasal steroid use flonase 1 spray each nostril BID.  Avoid triggers if possible.  Shower prior to bedtime if exposed to triggers.  If allergic dust/dust mites recommend mattress/pillow covers/encasements; washing linens, vacuuming, sweeping, dusting weekly.  Call or return to clinic as needed if these symptoms worsen or fail to improve as anticipated.   Exitcare handout on allergic rhinitis given to patient.  Patient  verbalized understanding of instructions, agreed with plan of care and had no further questions at this time.  P2:  Avoidance and hand washing.  Supportive treatment.   No evidence of invasive bacterial infection, non toxic and well hydrated.  This is most likely self limiting viral infection.  I do not see where any further testing or imaging is necessary at this time.   I will suggest supportive care, rest,  good hygiene and encourage the patient to take adequate fluids.  The patient is to return to clinic or EMERGENCY ROOM if symptoms worsen or change significantly e.g. ear pain, fever, purulent discharge from ears or bleeding.  Exitcare handout on otitis media with effusion given to patient.  Patient verbalized agreement and understanding of treatment plan.    Viral illness: no evidence of invasive bacterial infection, non toxic and well hydrated.  This is most likely self limiting viral infection.  I do not see where any further testing or imaging is necessary at this time.   I will suggest supportive care, rest, good hygiene and encourage the patient to take adequate fluids.  work excuse x 48 hours today and tomorrow given.  flonase 1 spray each nostril BID prn, nasal saline 1-2 sprays each nostril prn q2h  Discussed honey with lemon and salt water gargles for comfort also.  The patient is to return to clinic or EMERGENCY ROOM if symptoms worsen or change significantly e.g. fever, lethargy, SOB, wheezing.  Exitcare handout on viral illness given to patient.  Patient verbalized agreement and understanding of treatment plan.    start flonase 1 spray each nostril BID, saline 2 sprays each nostril q2h prn congestion. Tylenol 1029m po QID prn pain. If no improvement with 48 hours of saline and flonase use start augmentin 8737mpo BID x 10 days.  Rx given.  No evidence of systemic bacterial infection, non toxic and well hydrated.  I do not see where any further testing or imaging is necessary at this time.    I will suggest supportive care, rest, good hygiene and encourage the patient to take adequate fluids.  The patient is to return to clinic or EMERGENCY ROOM if symptoms worsen or change significantly.  Exitcare handout on sinusitis given to patient.  Patient verbalized agreement and understanding of treatment plan and had no further questions at this time.   P2:  Hand washing and cover cough  augmentin 87531mo BID x 10 days.  mycotussin 50m62m qhs prn cough.  Tessalon pearles 200mg72mTID prn cough.  Honey with lemon.  Bronchitis simple, community acquired, may have started as viral (probably respiratory syncytial, parainfluenza, influenza, or adenovirus), but now evidence of acute purulent bronchitis with resultant bronchial edema and mucus formation.  Viruses are the most common cause of bronchial inflammation in otherwise healthy adults with acute bronchitis.  The appearance of sputum is not predictive of whether a bacterial infection is present.  Purulent sputum is most often caused by viral infections.  There are a small portion of those caused by non-viral agents being Mycoplamsa pneumonia.  Microscopic examination or C&S of sputum in the healthy adult with acute bronchitis is generally not helpful (usually negative or normal respiratory flora) other considerations being cough from upper respiratory tract infections, sinusitis or allergic syndromes (mild asthma or viral pneumonia).  Differential Diagnosis:  reactive airway disease (asthma, allergic aspergillosis (eosinophilia), chronic bronchitis, respiratory infection (Sinusitis, Common cold, pneumonia), congestive heart failure, reflux esophagitis, bronchogenic tumor, aspiration syndromes and/or exposure irritants/tobacco smoke.  In this case, there is no evidence of any invasive bacterial illness.  Most likely viral etiology so will hold on antibiotic treatment.  Advise supportive care with rest, encourage fluids, good hygiene and watch for any  worsening symptoms.  If they were to develop:  come back to the office or go to the emergency room if after hours. Without high fever, severe dyspnea, lack of physical findings or other  risk factors, I will hold on a  CBC at this time. I discussed that approximately 50% of patients with acute bronchitis have a cough that lasts up to three weeks, and 25% for over a month.  Tylenol, one to two tablets every four hours as needed for fever or myalgias.   No aspirin.  Patient instructed to follow up in one week or sooner if symptoms worsen. Patient verbalized agreement and understanding of treatment plan.  P2:  hand washing and cover cough  School/work excuse note given to patient for 48 hours.  Usually no specific medical treatment is needed if a virus is causing the sore throat.  The throat most often gets better on its own within 5 to 7 days.  Antibiotic medicine does not cure viral pharyngitis.   For acute pharyngitis caused by bacteria, your healthcare provider will prescribe an antibiotic.  Marland Kitchen Do not smoke.  Marland Kitchen Avoid secondhand smoke and other air pollutants.  . Use a cool mist humidifier to add moisture to the air.  . Get plenty of rest.  . You may want to rest your throat by talking less and eating a diet that is mostly liquid or soft for a day or two.   Marland Kitchen Nonprescription throat lozenges and mouthwashes should help relieve the soreness.   . Gargling with warm saltwater and drinking warm liquids may help.  (You can make a saltwater solution by adding 1/4 teaspoon of salt to 8 ounces, or 240 mL, of warm water.)  . A nonprescription pain reliever such as aspirin, acetaminophen, or ibuprofen may ease general aches and pains.   FOLLOW UP with clinic provider if no improvements in the next 7-10 days.  Patient verbalized understanding of instructions and agreed with plan of care. P2:  Hand washing and diet.        Olen Cordial, NP 03/13/16 1243

## 2016-03-16 ENCOUNTER — Telehealth: Payer: Self-pay

## 2016-03-16 LAB — CULTURE, GROUP A STREP (THRC)

## 2016-03-16 NOTE — Telephone Encounter (Signed)
-----   Message from Frederich Cha, MD sent at 03/16/2016  2:04 PM EDT ----- Please notify patient that her strep is positive. But she is on Augmentin which should take care of this follow-up with her PCP in 2 weeks as needed.

## 2016-03-16 NOTE — Telephone Encounter (Signed)
Patient was informed of culture results and she is taking the antibiotic, however her back and throat are still sore. She will follow up with her PCP in a week or so if not feeling better.

## 2016-03-19 ENCOUNTER — Ambulatory Visit (INDEPENDENT_AMBULATORY_CARE_PROVIDER_SITE_OTHER): Payer: BLUE CROSS/BLUE SHIELD

## 2016-03-19 ENCOUNTER — Emergency Department: Payer: BLUE CROSS/BLUE SHIELD

## 2016-03-19 ENCOUNTER — Encounter: Payer: Self-pay | Admitting: Emergency Medicine

## 2016-03-19 ENCOUNTER — Observation Stay
Admission: EM | Admit: 2016-03-19 | Discharge: 2016-03-21 | Disposition: A | Discharge status: Home / Self Care | Payer: BLUE CROSS/BLUE SHIELD | Attending: Internal Medicine | Admitting: Internal Medicine

## 2016-03-19 ENCOUNTER — Ambulatory Visit (INDEPENDENT_AMBULATORY_CARE_PROVIDER_SITE_OTHER)
Admission: EM | Admit: 2016-03-19 | Discharge: 2016-03-19 | Disposition: A | Discharge status: Skilled Nursing Facility | Payer: BLUE CROSS/BLUE SHIELD | Source: Home / Self Care | Attending: Family Medicine | Admitting: Family Medicine

## 2016-03-19 DIAGNOSIS — M940 Chondrocostal junction syndrome [Tietze]: Secondary | ICD-10-CM | POA: Diagnosis not present

## 2016-03-19 DIAGNOSIS — J40 Bronchitis, not specified as acute or chronic: Secondary | ICD-10-CM | POA: Diagnosis not present

## 2016-03-19 DIAGNOSIS — E669 Obesity, unspecified: Secondary | ICD-10-CM | POA: Diagnosis not present

## 2016-03-19 DIAGNOSIS — E871 Hypo-osmolality and hyponatremia: Principal | ICD-10-CM | POA: Diagnosis present

## 2016-03-19 DIAGNOSIS — Z87891 Personal history of nicotine dependence: Secondary | ICD-10-CM | POA: Diagnosis not present

## 2016-03-19 DIAGNOSIS — J209 Acute bronchitis, unspecified: Secondary | ICD-10-CM | POA: Insufficient documentation

## 2016-03-19 DIAGNOSIS — R079 Chest pain, unspecified: Secondary | ICD-10-CM

## 2016-03-19 DIAGNOSIS — Z7984 Long term (current) use of oral hypoglycemic drugs: Secondary | ICD-10-CM | POA: Insufficient documentation

## 2016-03-19 DIAGNOSIS — Z6836 Body mass index (BMI) 36.0-36.9, adult: Secondary | ICD-10-CM | POA: Diagnosis not present

## 2016-03-19 DIAGNOSIS — E1165 Type 2 diabetes mellitus with hyperglycemia: Secondary | ICD-10-CM | POA: Diagnosis not present

## 2016-03-19 DIAGNOSIS — R0602 Shortness of breath: Secondary | ICD-10-CM | POA: Diagnosis present

## 2016-03-19 DIAGNOSIS — E119 Type 2 diabetes mellitus without complications: Secondary | ICD-10-CM

## 2016-03-19 LAB — BASIC METABOLIC PANEL
Anion gap: 9 (ref 5–15)
BUN: 9 mg/dL (ref 6–20)
CHLORIDE: 89 mmol/L — AB (ref 101–111)
CO2: 23 mmol/L (ref 22–32)
CREATININE: 0.62 mg/dL (ref 0.44–1.00)
Calcium: 8.8 mg/dL — ABNORMAL LOW (ref 8.9–10.3)
GFR calc Af Amer: >60 mL/min (ref >60)
GLUCOSE: 285 mg/dL — AB (ref 65–99)
POTASSIUM: 4.2 mmol/L (ref 3.5–5.1)
Sodium: 121 mmol/L — ABNORMAL LOW (ref 135–145)

## 2016-03-19 LAB — CBC WITH DIFFERENTIAL/PLATELET
Basophils Absolute: 0.2 10*3/uL — ABNORMAL HIGH (ref 0–0.1)
Basophils Relative: 1 %
EOS ABS: 0.2 10*3/uL (ref 0–0.7)
EOS PCT: 1 %
HCT: 44.2 % (ref 35.0–47.0)
Hemoglobin: 15.6 g/dL (ref 12.0–16.0)
LYMPHS ABS: 4.1 10*3/uL — AB (ref 1.0–3.6)
LYMPHS PCT: 37 %
MCH: 30.7 pg (ref 26.0–34.0)
MCHC: 35.3 g/dL (ref 32.0–36.0)
MCV: 86.9 fL (ref 80.0–100.0)
MONO ABS: 0.7 10*3/uL (ref 0.2–0.9)
MONOS PCT: 6 %
Neutro Abs: 5.9 10*3/uL (ref 1.4–6.5)
Neutrophils Relative %: 55 %
PLATELETS: 450 10*3/uL — AB (ref 150–440)
RBC: 5.08 MIL/uL (ref 3.80–5.20)
RDW: 13.1 % (ref 11.5–14.5)
WBC: 11 10*3/uL (ref 3.6–11.0)

## 2016-03-19 LAB — FIBRIN DERIVATIVES D-DIMER (ARMC ONLY): FIBRIN DERIVATIVES D-DIMER (ARMC): 189 (ref 0–499)

## 2016-03-19 MED ORDER — IPRATROPIUM-ALBUTEROL 0.5-2.5 (3) MG/3ML IN SOLN
3.0000 mL | Freq: Four times a day (QID) | RESPIRATORY_TRACT | Status: DC
  Administered 2016-03-19: 3 mL via RESPIRATORY_TRACT

## 2016-03-19 MED ORDER — PREDNISONE 20 MG PO TABS
40.0000 mg | ORAL_TABLET | Freq: Once | ORAL | Status: AC
  Administered 2016-03-19: 40 mg via ORAL
  Filled 2016-03-19: qty 2

## 2016-03-19 MED ORDER — IBUPROFEN 600 MG PO TABS
600.0000 mg | ORAL_TABLET | ORAL | Status: AC
  Administered 2016-03-19: 600 mg via ORAL
  Filled 2016-03-19: qty 1

## 2016-03-19 MED ORDER — SODIUM CHLORIDE 0.9 % IV BOLUS (SEPSIS)
500.0000 mL | Freq: Once | INTRAVENOUS | Status: AC
  Administered 2016-03-19: 500 mL via INTRAVENOUS

## 2016-03-19 MED ORDER — IOPAMIDOL (ISOVUE-370) INJECTION 76%
75.0000 mL | Freq: Once | INTRAVENOUS | Status: AC | PRN
  Administered 2016-03-19: 75 mL via INTRAVENOUS

## 2016-03-19 MED ORDER — IPRATROPIUM-ALBUTEROL 0.5-2.5 (3) MG/3ML IN SOLN
3.0000 mL | Freq: Once | RESPIRATORY_TRACT | Status: AC
  Administered 2016-03-19: 3 mL via RESPIRATORY_TRACT
  Filled 2016-03-19: qty 3

## 2016-03-19 MED ORDER — OXYCODONE HCL 5 MG PO TABS
5.0000 mg | ORAL_TABLET | Freq: Once | ORAL | Status: AC
  Administered 2016-03-19: 5 mg via ORAL
  Filled 2016-03-19: qty 1

## 2016-03-19 NOTE — ED Notes (Addendum)
Patient resting in room with NAD noted at this time. No verbalized needs made known. Results from CT recently returned; MD reviewing. VSS; NSR without ectopy noted on the monitor at a rate of 93.  Will continue to monitor.

## 2016-03-19 NOTE — ED Triage Notes (Signed)
Patient was seen last weekend and treated for Bronchitis and Strep Throat.  Patient states that her cough has not improved and is having pain when she takes a deep breath.

## 2016-03-19 NOTE — Discharge Instructions (Signed)
Husband to drive you directly to ER as discussed.

## 2016-03-19 NOTE — ED Triage Notes (Signed)
Pt states that she went to urgent care about feeling badly due to Bronchitis and strep. Pt states that Urgent Care sent her over here due to a Na level of 121. Pt c/o being tired and general body aches. Pt is in NAD at this time.

## 2016-03-19 NOTE — ED Provider Notes (Signed)
[facility] Emergency Department Provider Note  ____________________________________________   First MD Initiated Contact with Patient 03/19/16 2129     (approximate)  I have reviewed the triage vital signs and the nursing notes.   HISTORY  Chief Complaint Abnormal Lab    HPI Linda Peterson is a 45 y.o. female history of diabetes  Patient complains about one week of feeling congested, cough, and has been seen at urgent care twice currently on Augmentin. He reports continued to have a cough for right-sided sharp in nature. Occasional fevers and chills. Taking Tylenol, and continue to feeling more fatigued. Also was told she may have strep throat and is being treated for that  She does report fatigue, no confusion. Was seen in urgent care and there concerned for low sodium and she was just transferred here for further evaluation as well as to evaluate for etiology.   Past Medical History:  Diagnosis Date  . Diabetes mellitus without complication ([place])   . Skin cancer of face     Patient Active Problem List   Diagnosis Date Noted  . Hyponatremia 03/20/2016  . Obesity 07/09/2015  . Rectal bleeding 07/09/2015  . Diabetes mellitus ([place]) 06/03/2015  . Increased BMI 06/01/2015  . Left genital labial abscess 05/28/2015    Past Surgical History:  Procedure Laterality Date  . ABDOMINAL HYSTERECTOMY    . APPENDECTOMY    . INCISION AND DRAINAGE ABSCESS Left    genital  . SKIN BIOPSY     face  . SKIN CANCER EXCISION      Prior to Admission medications   Medication Sig Start Date End Date Taking? Authorizing Provider  albuterol (PROVENTIL HFA;VENTOLIN HFA) 108 (90 Base) MCG/ACT inhaler Inhale 1-2 puffs into the lungs every 6 (six) hours as needed for wheezing or shortness of breath. 03/13/16  Yes Olen Cordial, NP  amoxicillin-clavulanate (AUGMENTIN) 875-125 MG tablet Take 1 tablet by mouth every 12 (twelve) hours. 03/13/16  Yes Olen Cordial, NP  benzonatate (TESSALON) 100 MG capsule Take 1 capsule (100 mg total) by mouth every 8 (eight) hours. 03/13/16  Yes Olen Cordial, NP  chlorpheniramine-HYDROcodone (TUSSIONEX PENNKINETIC ER) 10-8 MG/5ML SUER Take 5 mLs by mouth every 12 (twelve) hours as needed for cough. 03/13/16  Yes Frederich Cha, MD  fluticasone (FLONASE) 50 MCG/ACT nasal spray Place 1 spray into both nostrils 2 (two) times daily. 03/13/16  Yes Olen Cordial, NP  metFORMIN (GLUCOPHAGE) 1000 MG tablet Take 1 tablet by mouth 2 (two) times daily. 06/18/15  Yes Historical Provider, MD  sodium chloride (OCEAN) 0.65 % SOLN nasal spray Place 2 sprays into both nostrils every 2 (two) hours while awake. 03/13/16 04/12/16 Yes Olen Cordial, NP  blood glucose meter kit and supplies Dispense based on patient and insurance preference. Use up to four times daily as directed. (FOR ICD-9 250.00, 250.01). 06/02/15   Brayton Mars, MD    Allergies Review of patient's allergies indicates no known allergies.  Family History  Problem Relation Age of Onset  . Diabetes Father   . Ovarian cancer Paternal Grandmother   . Diabetes Paternal Grandmother   . Colon cancer Maternal Grandmother   . Breast cancer Paternal Grandfather     Social History Social History  Substance Use Topics  . Smoking status: Former Smoker    Packs/day: 0.50    Years: 20.00    Types: Cigarettes    Quit date: 08/16/2014  . Smokeless tobacco: Never Used  .  Alcohol use No     Comment: occas    Review of Systems Constitutional: See history of present illness Eyes: No visual changes. ENT: No sore throat. Cardiovascular: See history of present illness Respiratory: See history of present illness Gastrointestinal: No abdominal pain.  No nausea, no vomiting.  No diarrhea.  No constipation. Genitourinary: Negative for dysuria. Musculoskeletal: Negative for back pain. Skin: Negative for rash. Neurological: Negative for headaches, focal  weakness or numbness.  10-point ROS otherwise negative.  ____________________________________________   PHYSICAL EXAM:  VITAL SIGNS: ED Triage Vitals  Enc Vitals Group     BP 03/19/16 2041 (!) 150/90     Pulse Rate 03/19/16 2041 (!) 111     Resp --      Temp 03/19/16 2041 98.4 F (36.9 C)     Temp Source 03/19/16 2041 Oral     SpO2 03/19/16 2041 96 %     Weight 03/19/16 2043 220 lb (99.8 kg)     Height 03/19/16 2043 _0  (1.651 m)     Head Circumference --      Peak Flow --      Pain Score 03/19/16 2047 6     Pain Loc --      Pain Edu? --      Excl. in [place]? --     Constitutional: Alert and oriented. Well appearing and in no acute distress.Appears somewhat fatigued Eyes: Conjunctivae are normal. PERRL. EOMI. Head: Atraumatic. Nose: No congestion/rhinnorhea. Mouth/Throat: Mucous membranes are dry.  Oropharynx non-erythematous. Neck: No stridor.   Cardiovascular: Slightly tachycardic rate, regular rhythm. Grossly normal heart sounds.  Good peripheral circulation. Respiratory: Normal respiratory effort.  No retractions. Lungs CTAB. Gastrointestinal: Soft and nontender. No distention. No abdominal bruits. No CVA tenderness. Musculoskeletal: No lower extremity tenderness nor edema.  No joint effusions. Neurologic:  Normal speech and language. No gross focal neurologic deficits are appreciated.  Skin:  Skin is warm, dry and intact. No rash noted. Psychiatric: Mood and affect are normal. Speech and behavior are normal.  ____________________________________________   LABS (all labs ordered are listed, but only abnormal results are displayed)  Labs Reviewed - No data to display ____________________________________________  EKG  Reviewed and interpreted by me at 2200 Heart rate 100 QRS 100 QTc 470 Sinus tachycardia, otherwise normal EKG ____________________________________________  RADIOLOGY  Dg Chest 2 View  Result Date: 03/19/2016 CLINICAL DATA:  Cough and  congestion EXAM: CHEST  2 VIEW COMPARISON:  03/13/2016 FINDINGS: The heart size and mediastinal contours are within normal limits. Both lungs are clear. The visualized skeletal structures are unremarkable. IMPRESSION: No active cardiopulmonary disease. Electronically Signed   By: Donavan Foil M.D.   On: 03/19/2016 18:28   Ct Angio Chest Pe W Or Wo Contrast  Result Date: 03/19/2016 CLINICAL DATA:  Acute onset of runny nose, nasal congestion and cough. Right-sided chest pain. Initial encounter. EXAM: CT ANGIOGRAPHY CHEST WITH CONTRAST TECHNIQUE: Multidetector CT imaging of the chest was performed using the standard protocol during bolus administration of intravenous contrast. Multiplanar CT image reconstructions and MIPs were obtained to evaluate the vascular anatomy. CONTRAST:  75 mL of Isovue 370 IV contrast COMPARISON:  Chest radiograph performed earlier today at 6:13 p.m. FINDINGS: Cardiovascular:  There is no evidence of pulmonary embolus. The heart is unremarkable in appearance. The thoracic aorta is unremarkable. No calcific atherosclerotic disease is seen. The great vessels are within normal limits. Mediastinum/Nodes: No mediastinal the lymphadenopathy is seen. No pericardial effusion is identified. The visualized portions of  the thyroid gland are unremarkable. No axillary lymphadenopathy is appreciated. Lungs/Pleura: The lungs are essentially clear bilaterally. No focal consolidation, pleural effusion or pneumothorax is seen. No masses are identified. Upper Abdomen: The visualized portions of the liver are unremarkable. The spleen is enlarged, measuring 15.0 cm in length. The visualized portions of the pancreas, adrenal glands and left kidney are unremarkable. Musculoskeletal: No acute osseous abnormalities are identified. The visualized musculature is unremarkable in appearance. Review of the MIP images confirms the above findings. IMPRESSION: 1. No evidence of pulmonary embolus. 2. Lungs clear  bilaterally. 3. Splenomegaly. Electronically Signed   By: Garald Balding M.D.   On: 03/19/2016 22:52    ____________________________________________   PROCEDURES  Procedure(s) performed: None  Procedures  Critical Care performed: No  ____________________________________________   INITIAL IMPRESSION / ASSESSMENT AND PLAN / ED COURSE  Pertinent labs & imaging results that were available during my care of the patient were reviewed by me and considered in my medical decision making (see chart for details).  Patient resents for 1-2 weeks of generalized upper respiratory type illness. Associated with pleuritic right-sided chest pain. EKG demonstrates no evidence of acute ischemic abnormality. Labs reveal moderate hyponatremia with associated fatigue.  Appears likely hyponatremia secondary to dehydration and volume depletion due to recent illness, diagnosis strep by her primary care physician however symptomatology to suggest upper respiratory type infection,/bronchitic-like symptoms in this long-term smoker. Tonsils appear normal on examination at this time, however. The patient did report having preceding sore throat.  Patient is hemodynamically stable, but moderate hyponatremia. Patient will be admitted to the hospital for further evaluation and workup  Clinical Course     ____________________________________________   FINAL CLINICAL IMPRESSION(S) / ED DIAGNOSES  Final diagnoses:  Chest pain  Hyponatremia      NEW MEDICATIONS STARTED DURING THIS VISIT:  New Prescriptions   No medications on file     Note:  This document was prepared using Dragon voice recognition software and may include unintentional dictation errors.     Delman Kitten, MD 03/20/16 [phone]

## 2016-03-19 NOTE — ED Provider Notes (Signed)
[facility] ____________________________________________  Time seen: Approximately 5:54 PM  I have reviewed the triage vital signs and the nursing notes.   HISTORY  Chief Complaint Shortness of Breath   HPI Linda Peterson is a 45 y.o. female presenting for the complaints of 1.5-2 weeks of runny nose, nasal congestion and cough. Patient reports that she was seen in urgent care 1 week ago for same complaints and has been taking medications. Patient reports she is currently taking Augmentin as prescribed without improvement. Patient reports she is still coughing, nasal congestion and reports the last 2 days she has had rib pain when taking a deep breath. Patient states pain initially to right chest when taking a deep breath but also reports the left as well as now. Denies chest pain without actively taking a deep breath. Also reports ribs or tender to touch as well. Denies fall or trauma. Patient reports overall the nasal congestion has improved but continues with cough. States has been intermittent using [employer] as well as Tussionex at night. States intermittent use an albuterol inhaler up to twice a day. Patient also reports from last urgent care visit, her strep throat culture returned positive for strep throat.  Patient reports that she has felt like she has had intermittent fevers. Patient states that she felt very warm and sweating while underneath the covers today but did not actually measure her temperature. Patient reports she last took Tylenol 3 PM this afternoon.  Patient denies chest pain, shortness of breath, but does report chest pain described as rib pain with deep breath. Denies recent surgeries, recent hospitalization, hemoptysis, nausea, vomiting, diarrhea, constipation, dizziness, fall or abdominal pain. Denies dysuria. Patient reports she is a diabetic but is controlled by diet. States only taking medications given at last urgent care visit. Denies history for  herself or family regarding pulmonary emboli or DVT or clotting disorders. Patient reports for the last week since she has been sick, she has not been very active and has been more secondary.  Past Medical History:  Diagnosis Date  . Diabetes mellitus without complication ([place])   . Skin cancer of face     Patient Active Problem List   Diagnosis Date Noted  . Obesity 07/09/2015  . Rectal bleeding 07/09/2015  . Diabetes mellitus ([place]) 06/03/2015  . Increased BMI 06/01/2015  . Left genital labial abscess 05/28/2015    Past Surgical History:  Procedure Laterality Date  . ABDOMINAL HYSTERECTOMY    . APPENDECTOMY    . INCISION AND DRAINAGE ABSCESS Left    genital  . SKIN BIOPSY     face  . SKIN CANCER EXCISION      Current Outpatient Rx  . Order #: [id number] Class: Normal  . Order #: [id number] Class: Normal  . Order #: [id number] Class: Normal  . Order #: [id number] Class: Normal  . Order #: [id number] Class: Normal  . Order #: [id number] Class: Normal  . Order #: [id number] Class: Historical Med  . Order #: [id number] Class: OTC    No current facility-administered medications for this encounter.   Current Outpatient Prescriptions:  .  albuterol (PROVENTIL HFA;VENTOLIN HFA) 108 (90 Base) MCG/ACT inhaler, Inhale 1-2 puffs into the lungs every 6 (six) hours as needed for wheezing or shortness of breath., Disp: 1 Inhaler, Rfl: 0 .  amoxicillin-clavulanate (AUGMENTIN) 875-125 MG tablet, Take 1 tablet by mouth every 12 (twelve) hours., Disp: 20 tablet, Rfl: 0 .  benzonatate (TESSALON) 100 MG capsule, Take 1 capsule (100 mg total) by mouth every 8 (eight) hours., Disp:  21 capsule, Rfl: 0 .  blood glucose meter kit and supplies, Dispense based on patient and insurance preference. Use up to four times daily as directed. (FOR ICD-9 250.00, 250.01)., Disp: 1 each, Rfl: 0 .  chlorpheniramine-HYDROcodone (TUSSIONEX PENNKINETIC ER) 10-8 MG/5ML SUER, Take 5 mLs by mouth every 12 (twelve) hours as needed  for cough., Disp: 115 mL, Rfl: 0 .  fluticasone (FLONASE) 50 MCG/ACT nasal spray, Place 1 spray into both nostrils 2 (two) times daily., Disp: 16 g, Rfl: 0 .  metFORMIN (GLUCOPHAGE) 1000 MG tablet, Take 1 tablet by mouth 2 (two) times daily., Disp: , Rfl:  .  sodium chloride (OCEAN) 0.65 % SOLN nasal spray, Place 2 sprays into both nostrils every 2 (two) hours while awake., Disp: , Rfl: 0  Allergies Review of patient's allergies indicates no known allergies.  Family History  Problem Relation Age of Onset  . Diabetes Father   . Ovarian cancer Paternal Grandmother   . Diabetes Paternal Grandmother   . Colon cancer Maternal Grandmother   . Breast cancer Paternal Grandfather     Social History Social History  Substance Use Topics  . Smoking status: Former Smoker    Packs/day: 0.50    Years: 20.00    Types: Cigarettes    Quit date: 08/16/2014  . Smokeless tobacco: Never Used  . Alcohol use No     Comment: occas    Review of Systems Constitutional: As above Eyes: No visual changes. ENT: No sore throat. Cardiovascular: Denies chest pain. Respiratory: Denies shortness of breath.As above Gastrointestinal: No abdominal pain.  No nausea, no vomiting.  No diarrhea.  No constipation. Genitourinary: Negative for dysuria. Musculoskeletal: Negative for back pain. Skin: Negative for rash. Neurological: Negative for headaches, focal weakness or numbness.  10-point ROS otherwise negative.  ____________________________________________   PHYSICAL EXAM:  VITAL SIGNS: ED Triage Vitals  Enc Vitals Group     BP 03/19/16 1747 (!) 156/96     Pulse Rate 03/19/16 1747 100     Resp 03/19/16 1747 17     Temp 03/19/16 1747 97 F (36.1 C)     Temp Source 03/19/16 1747 Tympanic     SpO2 03/19/16 1747 98 %     Weight 03/19/16 1748 220 lb (99.8 kg)     Height 03/19/16 1748 5' 5"  (1.651 m)     Head Circumference --      Peak Flow --      Pain Score 03/19/16 1749 7     Pain Loc --      Pain  Edu? --      Excl. in [place]? --    Today's Vitals   03/19/16 1749 03/19/16 1811 03/19/16 1900 03/19/16 1947  BP:  134/84 (!) 138/98   Pulse:  94 (!) 102   Resp:      Temp:      TempSrc:      SpO2:  97% 97%   Weight:      Height:      PainSc: 7    6    Constitutional: Alert and oriented. Well appearing and in no acute distress. Eyes: Conjunctivae are normal. PERRL. EOMI. ENT      Head: Normocephalic and atraumatic.      Ears: No erythema, normal TMs bilaterally.           Nose: Nasal congestion and rhinnorhea.      Mouth/Throat: Mucous membranes are moist.Oropharynx non-erythematous. No tonsillar swelling or exudate.  Neck: No stridor. Supple without  meningismus.  Hematological/Lymphatic/Immunilogical: No cervical lymphadenopathy. Cardiovascular: Normal rate, regular rhythm. Grossly normal heart sounds.  Good peripheral circulation. Respiratory: Normal respiratory effort without tachypnea nor retractions. Breath sounds are clear and equal bilaterallminimal scattered inspiratory wheezes noted. Patient regarding inspiration. Bilateral lateral and posterior ribs tender to direct palpation diffusely, right greater than left, no swelling, no ecchymosis. Gastrointestinal: Soft and nontender. obese abdomen.No CVA tenderness. Musculoskeletal:  Nontender with normal range of motion in all extremities. No midline cervical, thoracic or lumbar tenderness to palpation. Bilateral pedal pulses equal and easily palpated. No calf tenderness bilaterally. No extremity tenderness.  Neurologic:  Normal speech and language. No gross focal neurologic deficits are appreciated. Speech is normal. No gait instability.  Skin:  Skin is warm, dry and intact. No rash noted. Psychiatric: Mood and affect are normal. Speech and behavior are normal. Patient exhibits appropriate insight and judgment   ___________________________________________   LABS (all labs ordered are listed, but only abnormal results are  displayed)  Labs Reviewed  CBC WITH DIFFERENTIAL/PLATELET - Abnormal; Notable for the following:       Result Value   Platelets 450 (*)    Lymphs Abs 4.1 (*)    Basophils Absolute 0.2 (*)    All other components within normal limits  BASIC METABOLIC PANEL - Abnormal; Notable for the following:    Sodium 121 (*)    Chloride 89 (*)    Glucose, Bld 285 (*)    Calcium 8.8 (*)    All other components within normal limits  FIBRIN DERIVATIVES D-DIMER [facility] ONLY)    RADIOLOGY  Dg Chest 2 View  Result Date: 03/19/2016 CLINICAL DATA:  Cough and congestion EXAM: CHEST  2 VIEW COMPARISON:  03/13/2016 FINDINGS: The heart size and mediastinal contours are within normal limits. Both lungs are clear. The visualized skeletal structures are unremarkable. IMPRESSION: No active cardiopulmonary disease. Electronically Signed   By: Donavan Foil M.D.   On: 03/19/2016 18:28   ____________________________________________   PROCEDURES Procedures   _________________________________________   INITIAL IMPRESSION / ASSESSMENT AND PLAN / ED COURSE  Pertinent labs & imaging results that were available during my care of the patient were reviewed by me and considered in my medical decision making (see chart for details).  Patient appears anxious. Presents for contact to new complaints of cough, congestion with additional complaint of bilateral rib pain with deep breath. Denies trauma. Patient reports current antibiotic and cough medications as well as albuterol inhaler not resolving symptoms. Rib tenderness reproducible by direct palpation. Discussed in detail with patient evaluation. Will evaluate chest x-ray, duoneb given once in urgent care, CBC, BMP and d-dimer. Discussed in detail the evaluation regarding a d-dimer, and patient agrees to this with verbal understanding its elevation patient would then need to proceed to emergency room.   Post albuterol neb treatment in urgent care, wheezes resolved,  however patient reports continued cough as well as rib discomfort with inhalation. Chest xray per radiologist, no active cardiopulmonary disease. Awaiting labs.   Labs reviewed. Ddimer negative. Sodium noted to be 121. Patient denies history of hyponatremia, labs 9 months ago at 130. States weakness. No diuretics, no vomiting or diarrhea. Discussed in detail with weakness and noted hyponatremia, recommend patient to be seen and further evaluated in ER at this time. Suspect bronchitis and costochondritis continued regarding presenting complaints. Patient reports her husband will drive her to ER, patient stable at the time of discharge. Selinda Eon Warehouse manager at [facility] called and report given.   Discussed  follow up with Primary care physician this week. Discussed follow up and return parameters including no resolution or any worsening concerns. Patient verbalized understanding and agreed to plan.   ____________________________________________   FINAL CLINICAL IMPRESSION(S) / ED DIAGNOSES  Final diagnoses:  Hyponatremia  Bronchitis  Costochondritis     Discharge Medication List as of 03/19/2016  7:45 PM      Note: This dictation was prepared with Dragon dictation along with smaller phrase technology. Any transcriptional errors that result from this process are unintentional.    Clinical Course      Marylene Land, NP 03/19/16 2134

## 2016-03-20 DIAGNOSIS — E871 Hypo-osmolality and hyponatremia: Secondary | ICD-10-CM | POA: Diagnosis present

## 2016-03-20 LAB — BASIC METABOLIC PANEL
Anion gap: 8 (ref 5–15)
BUN: 11 mg/dL (ref 6–20)
CHLORIDE: 91 mmol/L — AB (ref 101–111)
CO2: 22 mmol/L (ref 22–32)
CREATININE: 0.63 mg/dL (ref 0.44–1.00)
Calcium: 8.6 mg/dL — ABNORMAL LOW (ref 8.9–10.3)
GFR calc Af Amer: >60 mL/min (ref >60)
GFR calc non Af Amer: >60 mL/min (ref >60)
Glucose, Bld: 524 mg/dL (ref 65–99)
POTASSIUM: 5.2 mmol/L — AB (ref 3.5–5.1)
SODIUM: 121 mmol/L — AB (ref 135–145)

## 2016-03-20 LAB — CBC
HEMATOCRIT: 39.2 % (ref 35.0–47.0)
Hemoglobin: 12.3 g/dL (ref 12.0–16.0)
MCH: 27.9 pg (ref 26.0–34.0)
MCHC: 31.4 g/dL — ABNORMAL LOW (ref 32.0–36.0)
MCV: 88.8 fL (ref 80.0–100.0)
PLATELETS: 356 10*3/uL (ref 150–440)
RBC: 4.41 MIL/uL (ref 3.80–5.20)
RDW: 13.4 % (ref 11.5–14.5)
WBC: 8.3 10*3/uL (ref 3.6–11.0)

## 2016-03-20 LAB — INFLUENZA PANEL BY PCR (TYPE A & B)
INFLBPCR: NEGATIVE
Influenza A By PCR: NEGATIVE

## 2016-03-20 LAB — GLUCOSE, CAPILLARY
GLUCOSE-CAPILLARY: 287 mg/dL — AB (ref 65–99)
GLUCOSE-CAPILLARY: 235 mg/dL — AB (ref 65–99)
Glucose-Capillary: 362 mg/dL — ABNORMAL HIGH (ref 65–99)
Glucose-Capillary: 351 mg/dL — ABNORMAL HIGH (ref 65–99)

## 2016-03-20 MED ORDER — LORATADINE 10 MG PO TABS
10.0000 mg | ORAL_TABLET | Freq: Every day | ORAL | Status: DC
  Administered 2016-03-20 – 2016-03-21 (×2): 10 mg via ORAL
  Filled 2016-03-20 (×2): qty 1

## 2016-03-20 MED ORDER — SODIUM CHLORIDE 0.9 % IV SOLN
INTRAVENOUS | Status: AC
  Administered 2016-03-20: 02:00:00 via INTRAVENOUS

## 2016-03-20 MED ORDER — SODIUM CHLORIDE 0.9 % IV SOLN
INTRAVENOUS | Status: DC
  Administered 2016-03-20 – 2016-03-21 (×2): via INTRAVENOUS

## 2016-03-20 MED ORDER — ENOXAPARIN SODIUM 40 MG/0.4ML ~~LOC~~ SOLN
40.0000 mg | SUBCUTANEOUS | Status: DC
  Administered 2016-03-20: 40 mg via SUBCUTANEOUS
  Filled 2016-03-20: qty 0.4

## 2016-03-20 MED ORDER — INSULIN ASPART 100 UNIT/ML ~~LOC~~ SOLN
0.0000 [IU] | Freq: Every day | SUBCUTANEOUS | Status: DC
  Administered 2016-03-20: 5 [IU] via SUBCUTANEOUS
  Filled 2016-03-20: qty 5

## 2016-03-20 MED ORDER — INSULIN GLARGINE 100 UNIT/ML ~~LOC~~ SOLN
10.0000 [IU] | Freq: Every day | SUBCUTANEOUS | Status: DC
  Filled 2016-03-20: qty 0.1

## 2016-03-20 MED ORDER — ACETAMINOPHEN 650 MG RE SUPP: 650.0000 mg | Freq: Four times a day (QID) | RECTAL | Status: DC | PRN

## 2016-03-20 MED ORDER — FLUTICASONE PROPIONATE 50 MCG/ACT NA SUSP
2.0000 | Freq: Every day | NASAL | Status: DC
  Administered 2016-03-20 – 2016-03-21 (×2): 2 via NASAL
  Filled 2016-03-20: qty 16

## 2016-03-20 MED ORDER — ONDANSETRON HCL 4 MG/2ML IJ SOLN: 4.0000 mg | Freq: Four times a day (QID) | INTRAMUSCULAR | Status: DC | PRN

## 2016-03-20 MED ORDER — ONDANSETRON HCL 4 MG PO TABS: 4.0000 mg | ORAL_TABLET | Freq: Four times a day (QID) | ORAL | Status: DC | PRN

## 2016-03-20 MED ORDER — KETOROLAC TROMETHAMINE 15 MG/ML IJ SOLN
15.0000 mg | Freq: Once | INTRAMUSCULAR | Status: AC
  Administered 2016-03-20: 15 mg via INTRAVENOUS
  Filled 2016-03-20: qty 1

## 2016-03-20 MED ORDER — MOMETASONE FURO-FORMOTEROL FUM 100-5 MCG/ACT IN AERO
2.0000 | INHALATION_SPRAY | Freq: Two times a day (BID) | RESPIRATORY_TRACT | Status: DC
  Administered 2016-03-20 – 2016-03-21 (×3): 2 via RESPIRATORY_TRACT
  Filled 2016-03-20: qty 8.8

## 2016-03-20 MED ORDER — INSULIN ASPART 100 UNIT/ML ~~LOC~~ SOLN
0.0000 [IU] | Freq: Three times a day (TID) | SUBCUTANEOUS | Status: DC
  Administered 2016-03-20: 5 [IU] via SUBCUTANEOUS
  Administered 2016-03-20: 9 [IU] via SUBCUTANEOUS
  Administered 2016-03-20: 3 [IU] via SUBCUTANEOUS
  Administered 2016-03-21: 7 [IU] via SUBCUTANEOUS
  Filled 2016-03-20: qty 3
  Filled 2016-03-20 (×2): qty 9
  Filled 2016-03-20: qty 5
  Filled 2016-03-20: qty 7
  Filled 2016-03-20: qty 9

## 2016-03-20 MED ORDER — AMOXICILLIN-POT CLAVULANATE 875-125 MG PO TABS
1.0000 | ORAL_TABLET | Freq: Two times a day (BID) | ORAL | Status: DC
  Administered 2016-03-20 – 2016-03-21 (×3): 1 via ORAL
  Filled 2016-03-20 (×3): qty 1

## 2016-03-20 MED ORDER — ALBUTEROL SULFATE (2.5 MG/3ML) 0.083% IN NEBU: 3.0000 mL | INHALATION_SOLUTION | Freq: Four times a day (QID) | RESPIRATORY_TRACT | Status: DC | PRN

## 2016-03-20 MED ORDER — ACETAMINOPHEN 325 MG PO TABS
650.0000 mg | ORAL_TABLET | Freq: Four times a day (QID) | ORAL | Status: DC | PRN
  Administered 2016-03-20 – 2016-03-21 (×3): 650 mg via ORAL
  Filled 2016-03-20 (×3): qty 2

## 2016-03-20 MED ORDER — INSULIN GLARGINE 100 UNIT/ML ~~LOC~~ SOLN
12.0000 [IU] | Freq: Every day | SUBCUTANEOUS | Status: DC
  Filled 2016-03-20: qty 0.12

## 2016-03-20 MED ORDER — CEPASTAT 14.5 MG MT LOZG
1.0000 | LOZENGE | OROMUCOSAL | Status: DC | PRN
  Filled 2016-03-20: qty 9

## 2016-03-20 MED ORDER — INSULIN ASPART 100 UNIT/ML ~~LOC~~ SOLN
10.0000 [IU] | Freq: Once | SUBCUTANEOUS | Status: AC
  Administered 2016-03-20: 10 [IU] via SUBCUTANEOUS
  Filled 2016-03-20: qty 10

## 2016-03-20 NOTE — ED Notes (Signed)
Dr. Jacqualine Code returns to bedside to speak with patient regarding test results and to update on POC.

## 2016-03-20 NOTE — ED Notes (Signed)
Pt transported to room 135 

## 2016-03-20 NOTE — H&P (Signed)
[facility] [place] NAME: Linda Peterson    MR#:  [id number]  DATE OF BIRTH:  [birth year]  DATE OF ADMISSION:  03/19/2016  PRIMARY CARE PHYSICIAN: No PCP Per Patient   REQUESTING/REFERRING PHYSICIAN: Jacqualine Code, MD  CHIEF COMPLAINT:   Chief Complaint  Patient presents with  . Abnormal Lab    HISTORY OF PRESENT ILLNESS:  Linda Peterson  is a 45 y.o. female who presents with Several days of malaise and fatigue as well as generalized weakness. Patient states that she was diagnosed earlier in the week with bronchitis and strep throat. She was placed on antibiotics for the same, but since that time is simply not felt any better. She went back for follow-up today and had blood work done and was found to have a low sodium. She was sent to the ED for further evaluation. Repeat sodium here was still low at 120. Hospitalists were called for admission and further evaluation.  PAST MEDICAL HISTORY:   Past Medical History:  Diagnosis Date  . Diabetes mellitus without complication ([place])   . Skin cancer of face     PAST SURGICAL HISTORY:   Past Surgical History:  Procedure Laterality Date  . ABDOMINAL HYSTERECTOMY    . APPENDECTOMY    . INCISION AND DRAINAGE ABSCESS Left    genital  . SKIN BIOPSY     face  . SKIN CANCER EXCISION      SOCIAL HISTORY:   Social History  Substance Use Topics  . Smoking status: Former Smoker    Packs/day: 0.50    Years: 20.00    Types: Cigarettes    Quit date: 08/16/2014  . Smokeless tobacco: Never Used  . Alcohol use No     Comment: occas    FAMILY HISTORY:   Family History  Problem Relation Age of Onset  . Diabetes Father   . Ovarian cancer Paternal Grandmother   . Diabetes Paternal Grandmother   . Colon cancer Maternal Grandmother   . Breast cancer Paternal Grandfather     DRUG ALLERGIES:  No Known Allergies  MEDICATIONS AT HOME:   Prior to Admission medications   Medication Sig Start  Date End Date Taking? Authorizing Provider  albuterol (PROVENTIL HFA;VENTOLIN HFA) 108 (90 Base) MCG/ACT inhaler Inhale 1-2 puffs into the lungs every 6 (six) hours as needed for wheezing or shortness of breath. 03/13/16  Yes Olen Cordial, NP  amoxicillin-clavulanate (AUGMENTIN) 875-125 MG tablet Take 1 tablet by mouth every 12 (twelve) hours. 03/13/16  Yes Olen Cordial, NP  benzonatate (TESSALON) 100 MG capsule Take 1 capsule (100 mg total) by mouth every 8 (eight) hours. 03/13/16  Yes Olen Cordial, NP  chlorpheniramine-HYDROcodone (TUSSIONEX PENNKINETIC ER) 10-8 MG/5ML SUER Take 5 mLs by mouth every 12 (twelve) hours as needed for cough. 03/13/16  Yes Frederich Cha, MD  fluticasone (FLONASE) 50 MCG/ACT nasal spray Place 1 spray into both nostrils 2 (two) times daily. 03/13/16  Yes Olen Cordial, NP  metFORMIN (GLUCOPHAGE) 1000 MG tablet Take 1 tablet by mouth 2 (two) times daily. 06/18/15  Yes Historical Provider, MD  sodium chloride (OCEAN) 0.65 % SOLN nasal spray Place 2 sprays into both nostrils every 2 (two) hours while awake. 03/13/16 04/12/16 Yes Olen Cordial, NP  blood glucose meter kit and supplies Dispense based on patient and insurance preference. Use up to four times daily as directed. (FOR ICD-9 250.00, 250.01). 06/02/15   Brayton Mars, MD  REVIEW OF SYSTEMS:  Review of Systems  Constitutional: Positive for malaise/fatigue. Negative for chills, fever and weight loss.  HENT: Negative for ear pain, hearing loss and tinnitus.   Eyes: Negative for blurred vision, double vision, pain and redness.  Respiratory: Negative for cough, hemoptysis and shortness of breath.   Cardiovascular: Negative for chest pain, palpitations, orthopnea and leg swelling.  Gastrointestinal: Negative for abdominal pain, constipation, diarrhea, nausea and vomiting.  Genitourinary: Negative for dysuria, frequency and hematuria.  Musculoskeletal: Negative for back pain, joint pain and  neck pain.  Skin:       No acne, rash, or lesions  Neurological: Positive for weakness. Negative for dizziness, tremors and focal weakness.  Endo/Heme/Allergies: Negative for polydipsia. Does not bruise/bleed easily.  Psychiatric/Behavioral: Negative for depression. The patient is not nervous/anxious and does not have insomnia.      VITAL SIGNS:   Vitals:   03/19/16 2043 03/19/16 2300 03/19/16 2330 03/20/16 0000  BP:  118/79 120/74 125/75  Pulse:  96 92 96  Resp:  19 17 17   Temp:      TempSrc:      SpO2:  93% 93% 90%  Weight: 99.8 kg (220 lb)     Height: 5' 5"  (1.651 m)      Wt Readings from Last 3 Encounters:  03/19/16 99.8 kg (220 lb)  03/19/16 99.8 kg (220 lb)  03/13/16 99.8 kg (220 lb)    PHYSICAL EXAMINATION:  Physical Exam  Vitals reviewed. Constitutional: She is oriented to person, place, and time. She appears well-developed and well-nourished. No distress.  HENT:  Head: Normocephalic and atraumatic.  Mouth/Throat: Oropharynx is clear and moist.  Eyes: Conjunctivae and EOM are normal. Pupils are equal, round, and reactive to light. No scleral icterus.  Neck: Normal range of motion. Neck supple. No JVD present. No thyromegaly present.  Cardiovascular: Normal rate, regular rhythm and intact distal pulses.  Exam reveals no gallop and no friction rub.   No murmur heard. Respiratory: Effort normal and breath sounds normal. No respiratory distress. She has no wheezes. She has no rales.  GI: Soft. Bowel sounds are normal. She exhibits no distension. There is no tenderness.  Musculoskeletal: Normal range of motion. She exhibits no edema.  No arthritis, no gout  Lymphadenopathy:    She has no cervical adenopathy.  Neurological: She is alert and oriented to person, place, and time. No cranial nerve deficit.  No dysarthria, no aphasia  Skin: Skin is warm and dry. No rash noted. No erythema.  Psychiatric: She has a normal mood and affect. Her behavior is normal. Judgment and  thought content normal.    LABORATORY PANEL:   CBC  Recent Labs Lab 03/19/16 1830  WBC 11.0  HGB 15.6  HCT 44.2  PLT 450*   ------------------------------------------------------------------------------------------------------------------  Chemistries   Recent Labs Lab 03/19/16 1830  NA 121*  K 4.2  CL 89*  CO2 23  GLUCOSE 285*  BUN 9  CREATININE 0.62  CALCIUM 8.8*   ------------------------------------------------------------------------------------------------------------------  Cardiac Enzymes No results for input(s): TROPONINI in the last 168 hours. ------------------------------------------------------------------------------------------------------------------  RADIOLOGY:  Dg Chest 2 View  Result Date: 03/19/2016 CLINICAL DATA:  Cough and congestion EXAM: CHEST  2 VIEW COMPARISON:  03/13/2016 FINDINGS: The heart size and mediastinal contours are within normal limits. Both lungs are clear. The visualized skeletal structures are unremarkable. IMPRESSION: No active cardiopulmonary disease. Electronically Signed   By: Donavan Foil M.D.   On: 03/19/2016 18:28   Ct Angio Chest Pe  W Or Wo Contrast  Result Date: 03/19/2016 CLINICAL DATA:  Acute onset of runny nose, nasal congestion and cough. Right-sided chest pain. Initial encounter. EXAM: CT ANGIOGRAPHY CHEST WITH CONTRAST TECHNIQUE: Multidetector CT imaging of the chest was performed using the standard protocol during bolus administration of intravenous contrast. Multiplanar CT image reconstructions and MIPs were obtained to evaluate the vascular anatomy. CONTRAST:  75 mL of Isovue 370 IV contrast COMPARISON:  Chest radiograph performed earlier today at 6:13 p.m. FINDINGS: Cardiovascular:  There is no evidence of pulmonary embolus. The heart is unremarkable in appearance. The thoracic aorta is unremarkable. No calcific atherosclerotic disease is seen. The great vessels are within normal limits. Mediastinum/Nodes: No  mediastinal the lymphadenopathy is seen. No pericardial effusion is identified. The visualized portions of the thyroid gland are unremarkable. No axillary lymphadenopathy is appreciated. Lungs/Pleura: The lungs are essentially clear bilaterally. No focal consolidation, pleural effusion or pneumothorax is seen. No masses are identified. Upper Abdomen: The visualized portions of the liver are unremarkable. The spleen is enlarged, measuring 15.0 cm in length. The visualized portions of the pancreas, adrenal glands and left kidney are unremarkable. Musculoskeletal: No acute osseous abnormalities are identified. The visualized musculature is unremarkable in appearance. Review of the MIP images confirms the above findings. IMPRESSION: 1. No evidence of pulmonary embolus. 2. Lungs clear bilaterally. 3. Splenomegaly. Electronically Signed   By: Garald Balding M.D.   On: 03/19/2016 22:52    EKG:   Orders placed or performed during the hospital encounter of 03/19/16  . ED EKG  . ED EKG  . EKG 12-Lead  . EKG 12-Lead    IMPRESSION AND PLAN:  Principal Problem:   Hyponatremia - unclear etiology at this time, the patient does state that she has had very poor by mouth intake for the last week or so. We will start tonight with some simple saline for hydration and monitor her sodium closely. If she does not improve with saline, she will need further workup at that point. Active Problems:   Diabetes mellitus ([place]) - sliding scale insulin with corresponding glucose checks and carb modified diet.  All the records are reviewed and case discussed with ED provider. Management plans discussed with the patient and/or family.  DVT PROPHYLAXIS: SubQ lovenox  GI PROPHYLAXIS: None  ADMISSION STATUS: Observation  CODE STATUS: Full Code Status History    Date Active Date Inactive Code Status Order ID Comments User Context   05/28/2015 12:03 PM 06/03/2015  5:04 PM Full Code [id number]  Brayton Mars, MD Inpatient       TOTAL TIME TAKING CARE OF THIS PATIENT: 40 minutes.    Ramces Shomaker Tolleson 03/20/2016, 12:43 AM  [facility]  Office  [phone]  CC: Primary care physician; No PCP Per Patient

## 2016-03-20 NOTE — Progress Notes (Signed)
MD paged for pt requests of Kpad and something diff than tylenol ordered for pain. Awaiting call back

## 2016-03-20 NOTE — ED Notes (Signed)
PO fluid provided per patient request. MD wants to limit oral intake due to HYPOnatremia; ok with patient having standard cup (240cc) of water at this time.

## 2016-03-20 NOTE — Progress Notes (Signed)
[facility] [place] NAME: Linda Peterson    MR#:  [id number]  DATE OF BIRTH:  [birth year]  SUBJECTIVE:  CHIEF COMPLAINT:   Chief Complaint  Patient presents with  . Abnormal Lab   -Patient had been having bronchitis symptoms and was diagnosed with strep throat throat and started on Augmentin as outpatient. -Came to the urgent care again for worsening body aches and noted to have sodium of 121. Started on IV fluids and sodium is still at 121  REVIEW OF SYSTEMS:  Review of Systems  Constitutional: Positive for malaise/fatigue. Negative for chills and fever.  HENT: Negative for ear discharge, ear pain and nosebleeds.   Eyes: Negative for blurred vision and double vision.  Respiratory: Positive for cough and shortness of breath. Negative for wheezing.   Cardiovascular: Negative for chest pain, palpitations and leg swelling.  Gastrointestinal: Negative for abdominal pain, constipation, diarrhea, nausea and vomiting.  Genitourinary: Negative for dysuria and urgency.  Musculoskeletal: Negative for myalgias.  Neurological: Negative for dizziness, tingling, tremors, seizures and headaches.  Psychiatric/Behavioral: Negative for depression.    DRUG ALLERGIES:  No Known Allergies  VITALS:  Blood pressure 111/74, pulse 73, temperature 98.4 F (36.9 C), temperature source Oral, resp. rate 18, height 5\' 5"  (1.651 m), weight 102.6 kg (226 lb 1.6 oz), SpO2 97 %.  PHYSICAL EXAMINATION:  Physical Exam  GENERAL:  45 y.o.-year-old patient lying in the bed with no acute distress.  EYES: Pupils equal, round, reactive to light and accommodation. No scleral icterus. Extraocular muscles intact.  HEENT: Head atraumatic, normocephalic. Oropharynx and nasopharynx clear. Congested voice. Mild posterior pharyngeal wall erythema NECK:  Supple, no jugular venous distention. No thyroid enlargement, no tenderness. No lymphadenopathy LUNGS: Normal breath sounds  bilaterally, no wheezing, rales,rhonchi or crepitation. No use of accessory muscles of respiration. Occasional scattered wheezes heard CARDIOVASCULAR: S1, S2 normal. No murmurs, rubs, or gallops.  ABDOMEN: Soft, nontender, nondistended. Bowel sounds present. No organomegaly or mass.  EXTREMITIES: No pedal edema, cyanosis, or clubbing.  NEUROLOGIC: Cranial nerves II through XII are intact. Muscle strength 5/5 in all extremities. Sensation intact. Gait not checked.  PSYCHIATRIC: The patient is alert and oriented x 3.  SKIN: No obvious rash, lesion, or ulcer.    LABORATORY PANEL:   CBC  Recent Labs Lab 03/20/16 0348  WBC 8.3  HGB 12.3  HCT 39.2  PLT 356   ------------------------------------------------------------------------------------------------------------------  Chemistries   Recent Labs Lab 03/20/16 0348  NA 121*  K 5.2*  CL 91*  CO2 22  GLUCOSE 524*  BUN 11  CREATININE 0.63  CALCIUM 8.6*   ------------------------------------------------------------------------------------------------------------------  Cardiac Enzymes No results for input(s): TROPONINI in the last 168 hours. ------------------------------------------------------------------------------------------------------------------  RADIOLOGY:  Dg Chest 2 View  Result Date: 03/19/2016 CLINICAL DATA:  Cough and congestion EXAM: CHEST  2 VIEW COMPARISON:  03/13/2016 FINDINGS: The heart size and mediastinal contours are within normal limits. Both lungs are clear. The visualized skeletal structures are unremarkable. IMPRESSION: No active cardiopulmonary disease. Electronically Signed   By: Donavan Foil M.D.   On: 03/19/2016 18:28   Ct Angio Chest Pe W Or Wo Contrast  Result Date: 03/19/2016 CLINICAL DATA:  Acute onset of runny nose, nasal congestion and cough. Right-sided chest pain. Initial encounter. EXAM: CT ANGIOGRAPHY CHEST WITH CONTRAST TECHNIQUE: Multidetector CT imaging of the chest was performed  using the standard protocol during bolus administration of intravenous contrast. Multiplanar CT image reconstructions and MIPs were obtained to evaluate  the vascular anatomy. CONTRAST:  75 mL of Isovue 370 IV contrast COMPARISON:  Chest radiograph performed earlier today at 6:13 p.m. FINDINGS: Cardiovascular:  There is no evidence of pulmonary embolus. The heart is unremarkable in appearance. The thoracic aorta is unremarkable. No calcific atherosclerotic disease is seen. The great vessels are within normal limits. Mediastinum/Nodes: No mediastinal the lymphadenopathy is seen. No pericardial effusion is identified. The visualized portions of the thyroid gland are unremarkable. No axillary lymphadenopathy is appreciated. Lungs/Pleura: The lungs are essentially clear bilaterally. No focal consolidation, pleural effusion or pneumothorax is seen. No masses are identified. Upper Abdomen: The visualized portions of the liver are unremarkable. The spleen is enlarged, measuring 15.0 cm in length. The visualized portions of the pancreas, adrenal glands and left kidney are unremarkable. Musculoskeletal: No acute osseous abnormalities are identified. The visualized musculature is unremarkable in appearance. Review of the MIP images confirms the above findings. IMPRESSION: 1. No evidence of pulmonary embolus. 2. Lungs clear bilaterally. 3. Splenomegaly. Electronically Signed   By: Garald Balding M.D.   On: 03/19/2016 22:52    EKG:   Orders placed or performed during the hospital encounter of 03/19/16  . ED EKG  . ED EKG  . EKG 12-Lead  . EKG 12-Lead    ASSESSMENT AND PLAN:   45 year old female with past medical history significant for non-insulin-dependent diabetes mellitus presents to hospital secondary to worsening myalgias and noted to have hyponatremia  #1 hyponatremia-likely acute from hypovolemia. -Continue IV normal saline. Check urine and serum osmolarity, urine sodium. -If does not improve, we'll  consult nephrology for Tolvaptan  #2 Acute bronchitis- started as augmentin as outpatient, has 3 more days to finish off the course. -Check flu test. -Added antihistamine, Flonase for symptomatic relief  #3 diabetes mellitus with hyperglycemia-received steroids for her bronchitis. -Metformin is held due to CT with contrast yesterday. Had low dose Lantus. Also on sliding scale insulin. Check A1c  #4 DVT prophylaxis-on Lovenox    All the records are reviewed and case discussed with Care Management/Social Workerr. Management plans discussed with the patient, family and they are in agreement.  CODE STATUS: Full code  TOTAL TIME TAKING CARE OF THIS PATIENT: 37 minutes.   POSSIBLE D/C IN 1-2 DAYS, DEPENDING ON CLINICAL CONDITION.   Gladstone Lighter M.D on 03/20/2016 at 1:56 PM  Between 7am to 6pm - Pager - [phone]  After 6pm go to [email or web address] - password [email or web address] [facility]  Office  [phone]  CC: Primary care physician; No PCP Per Patient

## 2016-03-20 NOTE — Progress Notes (Signed)
Pts. Blood sugar came back at 524. Dr. Marcille Blanco notified and ordered 10 units of novolog

## 2016-03-21 LAB — BASIC METABOLIC PANEL
Anion gap: 8 (ref 5–15)
Anion gap: 10 (ref 5–15)
BUN: 7 mg/dL (ref 6–20)
BUN: 10 mg/dL (ref 6–20)
CHLORIDE: 95 mmol/L — AB (ref 101–111)
CHLORIDE: 102 mmol/L (ref 101–111)
CO2: 23 mmol/L (ref 22–32)
CO2: 20 mmol/L — ABNORMAL LOW (ref 22–32)
Calcium: 8.9 mg/dL (ref 8.9–10.3)
Calcium: 8.5 mg/dL — ABNORMAL LOW (ref 8.9–10.3)
Creatinine, Ser: 0.49 mg/dL (ref 0.44–1.00)
Creatinine, Ser: 0.43 mg/dL — ABNORMAL LOW (ref 0.44–1.00)
GFR calc non Af Amer: >60 mL/min (ref >60)
GFR calc non Af Amer: >60 mL/min (ref >60)
Glucose, Bld: 263 mg/dL — ABNORMAL HIGH (ref 65–99)
Glucose, Bld: 265 mg/dL — ABNORMAL HIGH (ref 65–99)
POTASSIUM: 4.9 mmol/L (ref 3.5–5.1)
POTASSIUM: 3.7 mmol/L (ref 3.5–5.1)
SODIUM: 126 mmol/L — AB (ref 135–145)
SODIUM: 132 mmol/L — AB (ref 135–145)

## 2016-03-21 LAB — POTASSIUM: Potassium: 3.7 mmol/L (ref 3.5–5.1)

## 2016-03-21 LAB — GLUCOSE, CAPILLARY
GLUCOSE-CAPILLARY: 283 mg/dL — AB (ref 65–99)
GLUCOSE-CAPILLARY: 311 mg/dL — AB (ref 65–99)

## 2016-03-21 LAB — CK: CK TOTAL: 30 U/L — AB (ref 38–234)

## 2016-03-21 MED ORDER — MUPIROCIN CALCIUM 2 % EX CREA
TOPICAL_CREAM | Freq: Two times a day (BID) | CUTANEOUS | Status: DC
  Filled 2016-03-21: qty 15

## 2016-03-21 MED ORDER — GLIPIZIDE 5 MG PO TABS: 5.0000 mg | ORAL_TABLET | Freq: Two times a day (BID) | ORAL | 2 refills | Status: AC

## 2016-03-21 MED ORDER — TRAMADOL HCL 50 MG PO TABS: 50.0000 mg | ORAL_TABLET | Freq: Three times a day (TID) | ORAL | 0 refills | Status: AC

## 2016-03-21 MED ORDER — TRAMADOL HCL 50 MG PO TABS: 50.0000 mg | ORAL_TABLET | Freq: Three times a day (TID) | ORAL | Status: DC

## 2016-03-21 MED ORDER — BENZONATATE 100 MG PO CAPS
100.0000 mg | ORAL_CAPSULE | Freq: Three times a day (TID) | ORAL | Status: DC
  Administered 2016-03-21: 100 mg via ORAL
  Filled 2016-03-21: qty 1

## 2016-03-21 MED ORDER — MUPIROCIN CALCIUM 2 % EX CREA: TOPICAL_CREAM | Freq: Two times a day (BID) | CUTANEOUS | 0 refills | Status: AC

## 2016-03-21 MED ORDER — HYDROCOD POLST-CPM POLST ER 10-8 MG/5ML PO SUER
5.0000 mL | Freq: Two times a day (BID) | ORAL | Status: DC
  Administered 2016-03-21: 5 mL via ORAL
  Filled 2016-03-21: qty 5

## 2016-03-21 MED ORDER — MOMETASONE FURO-FORMOTEROL FUM 100-5 MCG/ACT IN AERO: 2.0000 | INHALATION_SPRAY | Freq: Two times a day (BID) | RESPIRATORY_TRACT | 0 refills | Status: AC

## 2016-03-21 MED ORDER — INSULIN GLARGINE 100 UNIT/ML ~~LOC~~ SOLN
20.0000 [IU] | Freq: Every day | SUBCUTANEOUS | Status: DC
  Administered 2016-03-21: 20 [IU] via SUBCUTANEOUS
  Filled 2016-03-21 (×2): qty 0.2

## 2016-03-21 MED ORDER — HYDROCOD POLST-CPM POLST ER 10-8 MG/5ML PO SUER: 5.0000 mL | Freq: Two times a day (BID) | ORAL | 0 refills | Status: AC

## 2016-03-21 NOTE — Progress Notes (Signed)
     Linda Peterson was admitted to the [facility] on 03/19/2016 for an acute medical condition and is being Discharged on  03/21/2016 . She is still recovering and will need another 2 days for recovery and so advised to stay away from work until then. So please excuse her from work for the above  Days. Should be able to return to work without any restrictions from 03/24/16.  Call Gladstone Lighter  MD, [facility] at  [phone] with questions.  Gladstone Lighter M.D on 03/21/2016,at 10:29 AM  [facility] [address] [place] [address]

## 2016-03-21 NOTE — Progress Notes (Signed)
Discharge summary reviewed with patient verbal understanding. 2 narcotic Rxs given at the time of discharge. Work related not printed. Belongings packed.

## 2016-03-21 NOTE — Discharge Summary (Signed)
[facility] NAME: Linda Peterson    MR#:  [id number]  DATE OF BIRTH:  [birth year]  DATE OF ADMISSION:  03/19/2016   ADMITTING PHYSICIAN: Lance Coon, MD  DATE OF DISCHARGE: 03/21/2016  PRIMARY CARE PHYSICIAN: No PCP Per Patient   ADMISSION DIAGNOSIS:   Hyponatremia [E87.1] Chest pain [R07.9]  DISCHARGE DIAGNOSIS:   Principal Problem:   Hyponatremia Active Problems:   Diabetes mellitus ([place])   SECONDARY DIAGNOSIS:   Past Medical History:  Diagnosis Date  . Diabetes mellitus without complication ([place])   . Skin cancer of face     HOSPITAL COURSE:   45 year old female with past medical history significant for non-insulin-dependent diabetes mellitus presents to hospital secondary to worsening myalgias and noted to have hyponatremia  #1 hyponatremia-likely acute from hypovolemia. -Received IV fluids in the hospital. Resolved hyponatremia  #2 Acute bronchitis- continue augmentin as outpatient, has 3 more days to finish off the course. -Flu test is negative. Added tramadol for myalgias. -Take an antihistamine, Flonase for symptomatic relief  #3 diabetes mellitus with hyperglycemia-received steroids for her bronchitis. Off steroids now, also has uncontrolled DM as outpatient since no PCP recently - a1c is still pending - discharge on metformin and glipizide and advised to get a PCP sooner and info given  Patient feels stable and wants to go home  DISCHARGE CONDITIONS:   Stable CONSULTS OBTAINED:    none  DRUG ALLERGIES:   No Known Allergies DISCHARGE MEDICATIONS:     Medication List    TAKE these medications   albuterol 108 (90 Base) MCG/ACT inhaler Commonly known as:  PROVENTIL HFA;VENTOLIN HFA Inhale 1-2 puffs into the lungs every 6 (six) hours as needed for wheezing or shortness of breath.   amoxicillin-clavulanate 875-125 MG tablet Commonly known as:  AUGMENTIN Take 1 tablet by mouth every 12  (twelve) hours.   benzonatate 100 MG capsule Commonly known as:  TESSALON Take 1 capsule (100 mg total) by mouth every 8 (eight) hours.   blood glucose meter kit and supplies Dispense based on patient and insurance preference. Use up to four times daily as directed. (FOR ICD-9 250.00, 250.01).   chlorpheniramine-HYDROcodone 10-8 MG/5ML Suer Commonly known as:  TUSSIONEX Take 5 mLs by mouth every 12 (twelve) hours. X 7 days What changed:  when to take this  reasons to take this  additional instructions   fluticasone 50 MCG/ACT nasal spray Commonly known as:  FLONASE Place 1 spray into both nostrils 2 (two) times daily.   glipiZIDE 5 MG tablet Commonly known as:  GLUCOTROL Take 1 tablet (5 mg total) by mouth 2 (two) times daily before a meal.   metFORMIN 1000 MG tablet Commonly known as:  GLUCOPHAGE Take 1 tablet by mouth 2 (two) times daily.   mometasone-formoterol 100-5 MCG/ACT Aero Commonly known as:  DULERA Inhale 2 puffs into the lungs 2 (two) times daily.   mupirocin cream 2 % Commonly known as:  BACTROBAN Apply topically 2 (two) times daily.   sodium chloride 0.65 % Soln nasal spray Commonly known as:  OCEAN Place 2 sprays into both nostrils every 2 (two) hours while awake.   traMADol 50 MG tablet Commonly known as:  ULTRAM Take 1 tablet (50 mg total) by mouth 3 (three) times daily. X 5 days ONLY        DISCHARGE INSTRUCTIONS:   1. PCP follow-up in 1-2 weeks  DIET:   Cardiac diet  ACTIVITY:   Activity  as tolerated  OXYGEN:   Home Oxygen: No.  Oxygen Delivery: room air  DISCHARGE LOCATION:   home   If you experience worsening of your admission symptoms, develop shortness of breath, life threatening emergency, suicidal or homicidal thoughts you must seek medical attention immediately by calling 911 or calling your MD immediately  if symptoms less severe.  You Must read complete instructions/literature along with all the possible adverse  reactions/side effects for all the Medicines you take and that have been prescribed to you. Take any new Medicines after you have completely understood and accpet all the possible adverse reactions/side effects.   Please note  You were cared for by a hospitalist during your hospital stay. If you have any questions about your discharge medications or the care you received while you were in the hospital after you are discharged, you can call the unit and asked to speak with the hospitalist on call if the hospitalist that took care of you is not available. Once you are discharged, your primary care physician will handle any further medical issues. Please note that NO REFILLS for any discharge medications will be authorized once you are discharged, as it is imperative that you return to your primary care physician (or establish a relationship with a primary care physician if you do not have one) for your aftercare needs so that they can reassess your need for medications and monitor your lab values.    On the day of Discharge:  VITAL SIGNS:   Blood pressure (!) 123/57, pulse 82, temperature 97.9 F (36.6 C), temperature source Oral, resp. rate 18, height 5' 5"  (1.651 m), weight 102.6 kg (226 lb 1.6 oz), SpO2 99 %.  PHYSICAL EXAMINATION:   GENERAL:  45 y.o.-year-old patient lying in the bed with no acute distress.  EYES: Pupils equal, round, reactive to light and accommodation. No scleral icterus. Extraocular muscles intact.  HEENT: Head atraumatic, normocephalic. Oropharynx and nasopharynx clear.  Mild posterior pharyngeal wall erythema NECK:  Supple, no jugular venous distention. No thyroid enlargement, no tenderness. No lymphadenopathy LUNGS: Normal breath sounds bilaterally, no wheezing, rales,rhonchi or crepitation. No use of accessory muscles of respiration. Occasional scattered wheezes heard CARDIOVASCULAR: S1, S2 normal. No murmurs, rubs, or gallops.  ABDOMEN: Soft, nontender, nondistended.  Bowel sounds present. No organomegaly or mass.  EXTREMITIES: No pedal edema, cyanosis, or clubbing.  NEUROLOGIC: Cranial nerves II through XII are intact. Muscle strength 5/5 in all extremities. Sensation intact. Gait not checked.  PSYCHIATRIC: The patient is alert and oriented x 3.  SKIN: No obvious rash, lesion, or ulcer.   DATA REVIEW:   CBC  Recent Labs Lab 03/20/16 0348  WBC 8.3  HGB 12.3  HCT 39.2  PLT 356    Chemistries   Recent Labs Lab 03/21/16 0319  NA 132*  K 3.7  CL 102  CO2 20*  GLUCOSE 265*  BUN 10  CREATININE 0.43*  CALCIUM 8.5*     Microbiology Results  Results for orders placed or performed during the hospital encounter of 03/13/16  Rapid strep screen     Status: None   Collection Time: 03/13/16 11:31 AM  Result Value Ref Range Status   Streptococcus, Group A Screen (Direct) NEGATIVE NEGATIVE Final    Comment: (NOTE) A Rapid Antigen test may result negative if the antigen level in the sample is below the detection level of this test. The FDA has not cleared this test as a stand-alone test therefore the rapid antigen negative result has reflexed  to a Group A Strep culture.   Culture, group A strep     Status: None   Collection Time: 03/13/16 11:31 AM  Result Value Ref Range Status   Specimen Description THROAT  Final   Special Requests NONE Reflexed from [id number]  Final   Culture FEW STREPTOCOCCUS,BETA HEMOLYTIC NOT GROUP A  Final   Report Status 03/16/2016 FINAL  Final    RADIOLOGY:  No results found.   Management plans discussed with the patient, family and they are in agreement.  CODE STATUS:     Code Status Orders        Start     Ordered   03/20/16 0206  Full code  Continuous     03/20/16 0205    Code Status History    Date Active Date Inactive Code Status Order ID Comments User Context   05/28/2015 12:03 PM 06/03/2015  5:04 PM Full Code [id number]  Brayton Mars, MD Inpatient      TOTAL TIME TAKING CARE OF THIS  PATIENT: 37 minutes.    Gladstone Lighter M.D on 03/21/2016 at 12:16 PM  Between 7am to 6pm - Pager - [phone]  After 6pm go to [email or web address] - [employer] [facility]  Office  [phone]  CC: Primary care physician; No PCP Per Patient   Note: This dictation was prepared with Dragon dictation along with smaller phrase technology. Any transcriptional errors that result from this process are unintentional.

## 2016-03-22 LAB — HEMOGLOBIN A1C
HEMOGLOBIN A1C: 11.4 % — AB (ref 4.8–5.6)
MEAN PLASMA GLUCOSE: 280 mg/dL

## 2016-07-13 ENCOUNTER — Encounter: Payer: BLUE CROSS/BLUE SHIELD | Admitting: Obstetrics and Gynecology

## 2018-01-05 ENCOUNTER — Other Ambulatory Visit: Payer: Self-pay | Admitting: Family Medicine

## 2018-01-05 ENCOUNTER — Ambulatory Visit
Admission: RE | Admit: 2018-01-05 | Discharge: 2018-01-05 | Disposition: A | Discharge status: Home / Self Care | Payer: BLUE CROSS/BLUE SHIELD | Source: Ambulatory Visit | Attending: Family Medicine | Admitting: Family Medicine

## 2018-01-05 DIAGNOSIS — M5431 Sciatica, right side: Secondary | ICD-10-CM

## 2019-02-27 ENCOUNTER — Ambulatory Visit (INDEPENDENT_AMBULATORY_CARE_PROVIDER_SITE_OTHER): Payer: BC Managed Care – PPO

## 2019-02-27 ENCOUNTER — Other Ambulatory Visit: Payer: Self-pay

## 2019-02-27 ENCOUNTER — Encounter: Payer: Self-pay | Admitting: Podiatry

## 2019-02-27 ENCOUNTER — Other Ambulatory Visit: Payer: Self-pay | Admitting: Podiatry

## 2019-02-27 ENCOUNTER — Ambulatory Visit: Payer: BC Managed Care – PPO | Admitting: Podiatry

## 2019-02-27 VITALS — BP 131/70

## 2019-02-27 DIAGNOSIS — M779 Enthesopathy, unspecified: Secondary | ICD-10-CM | POA: Diagnosis not present

## 2019-02-27 DIAGNOSIS — M79672 Pain in left foot: Secondary | ICD-10-CM

## 2019-02-27 DIAGNOSIS — M79674 Pain in right toe(s): Secondary | ICD-10-CM

## 2019-02-27 DIAGNOSIS — M792 Neuralgia and neuritis, unspecified: Secondary | ICD-10-CM | POA: Diagnosis not present

## 2019-02-27 DIAGNOSIS — B351 Tinea unguium: Secondary | ICD-10-CM | POA: Diagnosis not present

## 2019-02-27 DIAGNOSIS — M79675 Pain in left toe(s): Secondary | ICD-10-CM

## 2019-02-27 DIAGNOSIS — M79671 Pain in right foot: Secondary | ICD-10-CM

## 2019-02-27 MED ORDER — GABAPENTIN 300 MG PO CAPS: ORAL_CAPSULE | ORAL | 1 refills | Status: DC

## 2019-02-28 ENCOUNTER — Encounter: Payer: Self-pay | Admitting: Podiatry

## 2019-02-28 LAB — HEPATIC FUNCTION PANEL
AG Ratio: 1.6 (calc) (ref 1.0–2.5)
ALT: 11 U/L (ref 6–29)
AST: 11 U/L (ref 10–35)
Albumin: 4 g/dL (ref 3.6–5.1)
Alkaline phosphatase (APISO): 61 U/L (ref 37–153)
Bilirubin, Direct: 0.1 mg/dL (ref 0.0–0.2)
Globulin: 2.5 g/dL (calc) (ref 1.9–3.7)
Indirect Bilirubin: 0.5 mg/dL (calc) (ref 0.2–1.2)
Total Bilirubin: 0.6 mg/dL (ref 0.2–1.2)
Total Protein: 6.5 g/dL (ref 6.1–8.1)

## 2019-02-28 MED ORDER — TERBINAFINE HCL 250 MG PO TABS: 250.0000 mg | ORAL_TABLET | Freq: Every day | ORAL | 0 refills | Status: AC

## 2019-02-28 NOTE — Progress Notes (Signed)
Subjective:  Patient ID: Linda Peterson, female    DOB: 04/14/1971,  MRN: [id number]  Chief Complaint  Patient presents with   Nail Problem    pt is here for numbness in both of her feet, pt states that she also hit a cast iron on the right anterior side of the ankle, pt also states that the foot pain has been going on recently in the past 2 months   48 y.o. female returns for the above complaint.  Patient presents today with complaints of bilateral numbness and neuropathy to both of her feet.  She states that the pain has gotten worse over time.  She also has secondary complaints of onychomycosis of all of her toenails x10.  She is also complaining of anterior ankle pain that has been going on for 2 months and is going down slowly but still there.  She had an injury to the area 2 months ago which has healed since then however she still has leg edema bone bruise type of pain.  Objective:   Vitals:   02/27/19 1321  BP: 131/70   Podiatric Exam: Vascular: dorsalis pedis and posterior tibial pulses are palpable bilateral. Capillary return is immediate. Temperature gradient is WNL. Skin turgor WNL  Sensorium: Decrease in protective sensation to the distal digits up to the metatarsal head with Symes Weinstein filament testing. Nail Exam: Pt has thick disfigured discolored nails with subungual debris noted bilateral entire nail hallux through fifth toenails Ulcer Exam: There is no evidence of ulcer or pre-ulcerative changes or infection. Orthopedic Exam: Muscle tone and strength are WNL. No limitations in general ROM. No crepitus or effusions noted. HAV  B/L.  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  Pain on palpation to her anterior right distal tibia.  No pain on on range of motion active or passive. Skin: No Porokeratosis. No infection or ulcers  Radiographs: Bilateral 2 views of skeletally mature adult: There are no associated fracture noted.  No other bony deformities noted.  There is some arthritic changes at  the ankle joint at the subtalar joint of bilateral feet.  No other bony deformities noted.  Assessment & Plan:  Patient was evaluated and treated and all questions answered.  Neuropathic pain -I educated instructed and provided treatment options for the etiology of neuropathy in cases of diabetes.  I stressed the importance of maintaining good blood glucose level in order to decrease the pain of neuropathy. -Gabapentin was dispensed to pharmacy.  Onychomycosis with pain  -Nails palliatively debrided as below. -Educated on self-care -I stressed and educated her on the etiology of onychomycosis and gave her various treatment options including topical p.o. option and laser therapy. -Patient elected to have the topical option to clear out the fungus in her toenails. -I sent her to obtain liver function studies then I will start her on Lamisil therapy. -Her liver function studies were done after the visit.  The liver function panel was normal.  Therefore I will start her on Lamisil therapy and the prescription was sent to pharmacy.  Procedure: Nail Debridement Rationale: pain  Type of Debridement: manual, sharp debridement. Instrumentation: Nail nipper, rotary burr. Number of Nails: 10  Procedures and Treatment: Consent by patient was obtained for treatment procedures. The patient understood the discussion of treatment and procedures well. All questions were answered thoroughly reviewed. Debridement of mycotic and hypertrophic toenails, 1 through 5 bilateral and clearing of subungual debris. No ulceration, no infection noted.  Return Visit-Office Procedure: Patient instructed to return to  the office for a follow up visit 3 months for continued evaluation and treatment.  Boneta Lucks, DPM    Return in about 3 months (around 05/30/2019).

## 2019-05-30 ENCOUNTER — Ambulatory Visit: Payer: BC Managed Care – PPO | Admitting: Podiatry

## 2019-05-30 ENCOUNTER — Other Ambulatory Visit: Payer: Self-pay

## 2019-05-30 DIAGNOSIS — M2141 Flat foot [pes planus] (acquired), right foot: Secondary | ICD-10-CM | POA: Diagnosis not present

## 2019-05-30 DIAGNOSIS — M792 Neuralgia and neuritis, unspecified: Secondary | ICD-10-CM | POA: Diagnosis not present

## 2019-05-30 DIAGNOSIS — B351 Tinea unguium: Secondary | ICD-10-CM | POA: Diagnosis not present

## 2019-05-30 DIAGNOSIS — M2142 Flat foot [pes planus] (acquired), left foot: Secondary | ICD-10-CM

## 2019-05-30 DIAGNOSIS — M79675 Pain in left toe(s): Secondary | ICD-10-CM | POA: Diagnosis not present

## 2019-05-30 DIAGNOSIS — M79674 Pain in right toe(s): Secondary | ICD-10-CM

## 2019-05-31 ENCOUNTER — Telehealth: Payer: Self-pay | Admitting: Podiatry

## 2019-05-31 ENCOUNTER — Encounter: Payer: Self-pay | Admitting: Podiatry

## 2019-05-31 MED ORDER — GABAPENTIN 300 MG PO CAPS: 300.0000 mg | ORAL_CAPSULE | Freq: Every day | ORAL | 3 refills | Status: DC

## 2019-05-31 NOTE — Telephone Encounter (Signed)
I called pt, and informed the Gabapentin would be sent to her pharmacy.

## 2019-05-31 NOTE — Progress Notes (Signed)
  Subjective:  Patient ID: Linda Peterson, female    DOB: 14-Jul-1970,  MRN: [id number]  No chief complaint on file.  49 y.o. female returns for the above complaint.  Patient is here for a follow-up of toenail fungus treatment that she is undergoing with Lamisil therapy.  Patient states the nails are improving slowly but surely.  She denies any other acute complaints.  She also states that she is on her feet working on [place] with about 15 miles a day and her and sometimes her foot starts aching.  I have educated her shoe modification she may benefit from custom-made orthotics to help support the arches of her foot.  She denies any other acute complaints. Objective:   There were no vitals filed for this visit. Podiatric Exam: Vascular: dorsalis pedis and posterior tibial pulses are palpable bilateral. Capillary return is immediate. Temperature gradient is WNL. Skin turgor WNL  Sensorium: Decrease in protective sensation to the distal digits up to the metatarsal head with Symes Weinstein filament testing. Nail Exam: Proximal nail fold shows clear nondiscolored nail protruding from underneath the nail fold.  It appears that the nails are clearing up with Lamisil therapy. Ulcer Exam: There is no evidence of ulcer or pre-ulcerative changes or infection. Orthopedic Exam: Muscle tone and strength are WNL. No limitations in general ROM. No crepitus or effusions noted. HAV  B/L.  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  Pain on palpation to her anterior right distal tibia.  No pain on on range of motion active or passive. Skin: No Porokeratosis. No infection or ulcers  Radiographs: Bilateral 2 views of skeletally mature adult: None  Assessment & Plan:  Patient was evaluated and treated and all questions answered.  Neuropathic pain -I educated instructed and provided treatment options for the etiology of neuropathy in cases of diabetes.  I stressed the importance of maintaining good blood glucose level in order to  decrease the pain of neuropathy. -Patient is doing well on gabapentin therapy.  Patient will look into how much she has left and she will let me know if she needs another refill.  Onychomycosis with pain  -Patient is doing really well on Lamisil therapy as the proximal nail fold shows clearing of the toenails.  Patient still has another month of course left.  I have asked her to complete the course and come see me afterwards to evaluate the nail.  If not completely cleared patient will benefit from another 4-month course.  Flexible flatfoot deformity -I explained to the patient the etiology of pes planus as well as various treatment options associated with that including orthotics.  Given that she is very aggressive on her feet working on [employer] about 15 miles a day I believe she will benefit from a good arch support as well as heel support.  Patient agrees with the plan and would like to obtain custom-made orthotics. -Patient will be scheduled to see rick for custom made orthotics.  Boneta Lucks, DPM    Return in about 1 week (around 06/06/2019) for Sched with Liliane Channel for [employer].

## 2019-05-31 NOTE — Addendum Note (Signed)
Addended by: Harriett Sine D on: 05/31/2019 04:41 PM   Modules accepted: Orders

## 2019-05-31 NOTE — Telephone Encounter (Signed)
Pt was seen in office yesterday and was supposed to have a refill on her gabapentin sent to her pharmacy. Please send to [facility] on [address]

## 2019-06-01 MED ORDER — GABAPENTIN 300 MG PO CAPS: 300.0000 mg | ORAL_CAPSULE | Freq: Every day | ORAL | 3 refills | Status: AC

## 2019-06-01 NOTE — Telephone Encounter (Signed)
Pt calling back regarding prescription for Gabapentin. Prescription was sent to the incorrect pharmacy yesterday.   Please resend to [facility] on [address]

## 2019-06-01 NOTE — Addendum Note (Signed)
Addended by: Harriett Sine D on: 06/01/2019 01:17 PM   Modules accepted: Orders

## 2019-06-08 ENCOUNTER — Ambulatory Visit
Admission: RE | Admit: 2019-06-08 | Discharge: 2019-06-08 | Disposition: A | Discharge status: Home / Self Care | Payer: BC Managed Care – PPO | Source: Ambulatory Visit | Attending: Family Medicine | Admitting: Family Medicine

## 2019-06-08 ENCOUNTER — Other Ambulatory Visit: Payer: Self-pay | Admitting: Family Medicine

## 2019-06-08 DIAGNOSIS — R059 Cough, unspecified: Secondary | ICD-10-CM

## 2019-06-08 DIAGNOSIS — R05 Cough: Secondary | ICD-10-CM

## 2019-06-12 ENCOUNTER — Other Ambulatory Visit: Payer: Self-pay

## 2019-06-12 ENCOUNTER — Other Ambulatory Visit: Payer: BC Managed Care – PPO | Admitting: Orthotics

## 2019-07-16 ENCOUNTER — Other Ambulatory Visit: Payer: BC Managed Care – PPO | Admitting: Orthotics

## 2019-08-01 ENCOUNTER — Ambulatory Visit: Payer: BC Managed Care – PPO | Admitting: Podiatry

## 2019-08-22 ENCOUNTER — Ambulatory Visit: Payer: BC Managed Care – PPO | Admitting: Podiatry

## 2019-12-20 ENCOUNTER — Emergency Department (HOSPITAL_COMMUNITY)
Admission: EM | Admit: 2019-12-20 | Discharge: 2019-12-20 | Disposition: A | Discharge status: Home / Self Care | Payer: BC Managed Care – PPO | Attending: Emergency Medicine | Admitting: Emergency Medicine

## 2019-12-20 ENCOUNTER — Encounter (HOSPITAL_COMMUNITY): Payer: Self-pay

## 2019-12-20 ENCOUNTER — Emergency Department (HOSPITAL_COMMUNITY): Payer: BC Managed Care – PPO

## 2019-12-20 ENCOUNTER — Other Ambulatory Visit: Payer: Self-pay

## 2019-12-20 DIAGNOSIS — R1110 Vomiting, unspecified: Secondary | ICD-10-CM | POA: Diagnosis not present

## 2019-12-20 DIAGNOSIS — R519 Headache, unspecified: Secondary | ICD-10-CM | POA: Insufficient documentation

## 2019-12-20 DIAGNOSIS — Z5321 Procedure and treatment not carried out due to patient leaving prior to being seen by health care provider: Secondary | ICD-10-CM | POA: Diagnosis not present

## 2019-12-20 LAB — CBC WITH DIFFERENTIAL/PLATELET
Abs Immature Granulocytes: 0.05 10*3/uL (ref 0.00–0.07)
Basophils Absolute: 0 10*3/uL (ref 0.0–0.1)
Basophils Relative: 0 %
Eosinophils Absolute: 0.2 10*3/uL (ref 0.0–0.5)
Eosinophils Relative: 2 %
HCT: 42.1 % (ref 36.0–46.0)
Hemoglobin: 14.2 g/dL (ref 12.0–15.0)
Immature Granulocytes: 0 %
Lymphocytes Relative: 25 %
Lymphs Abs: 3 10*3/uL (ref 0.7–4.0)
MCH: 29.9 pg (ref 26.0–34.0)
MCHC: 33.7 g/dL (ref 30.0–36.0)
MCV: 88.6 fL (ref 80.0–100.0)
Monocytes Absolute: 0.7 10*3/uL (ref 0.1–1.0)
Monocytes Relative: 6 %
Neutro Abs: 8 10*3/uL — ABNORMAL HIGH (ref 1.7–7.7)
Neutrophils Relative %: 67 %
Platelets: 480 10*3/uL — ABNORMAL HIGH (ref 150–400)
RBC: 4.75 MIL/uL (ref 3.87–5.11)
RDW: 12.4 % (ref 11.5–15.5)
WBC: 12 10*3/uL — ABNORMAL HIGH (ref 4.0–10.5)
nRBC: 0 % (ref 0.0–0.2)

## 2019-12-20 LAB — COMPREHENSIVE METABOLIC PANEL
ALT: 13 U/L (ref 0–44)
AST: 17 U/L (ref 15–41)
Albumin: 4.2 g/dL (ref 3.5–5.0)
Alkaline Phosphatase: 61 U/L (ref 38–126)
Anion gap: 11 (ref 5–15)
BUN: 7 mg/dL (ref 6–20)
CO2: 24 mmol/L (ref 22–32)
Calcium: 9.8 mg/dL (ref 8.9–10.3)
Chloride: 100 mmol/L (ref 98–111)
Creatinine, Ser: 0.62 mg/dL (ref 0.44–1.00)
GFR calc Af Amer: >60 mL/min (ref >60)
GFR calc non Af Amer: >60 mL/min (ref >60)
Glucose, Bld: 127 mg/dL — ABNORMAL HIGH (ref 70–99)
Potassium: 4.2 mmol/L (ref 3.5–5.1)
Sodium: 135 mmol/L (ref 135–145)
Total Bilirubin: 0.5 mg/dL (ref 0.3–1.2)
Total Protein: 7.1 g/dL (ref 6.5–8.1)

## 2019-12-20 MED ORDER — FENTANYL CITRATE (PF) 100 MCG/2ML IJ SOLN
50.0000 ug | INTRAMUSCULAR | Status: DC | PRN
  Administered 2019-12-20: 50 ug via INTRAVENOUS
  Filled 2019-12-20: qty 2

## 2019-12-20 MED ORDER — ONDANSETRON HCL 4 MG/2ML IJ SOLN
4.0000 mg | Freq: Once | INTRAMUSCULAR | Status: AC
  Administered 2019-12-20: 4 mg via INTRAVENOUS
  Filled 2019-12-20: qty 2

## 2019-12-20 NOTE — ED Triage Notes (Signed)
Pt arrives POV for eval of severe onset HA, w/ vomiting. Pt reports HA since last night, worse today. Vomiting all day today. Neuro intact in triage.

## 2019-12-20 NOTE — ED Notes (Signed)
Pt states she is leaving  

## 2020-04-25 IMAGING — CR DG CHEST 2V
2 series · 2 of 2 positions shown · non-contrast
Comparison: 03/19/2016

CLINICAL DATA: Cough for several weeks.  Asthma.  Diabetes.

EXAM:
CHEST - 2 VIEW

[w chest pa]
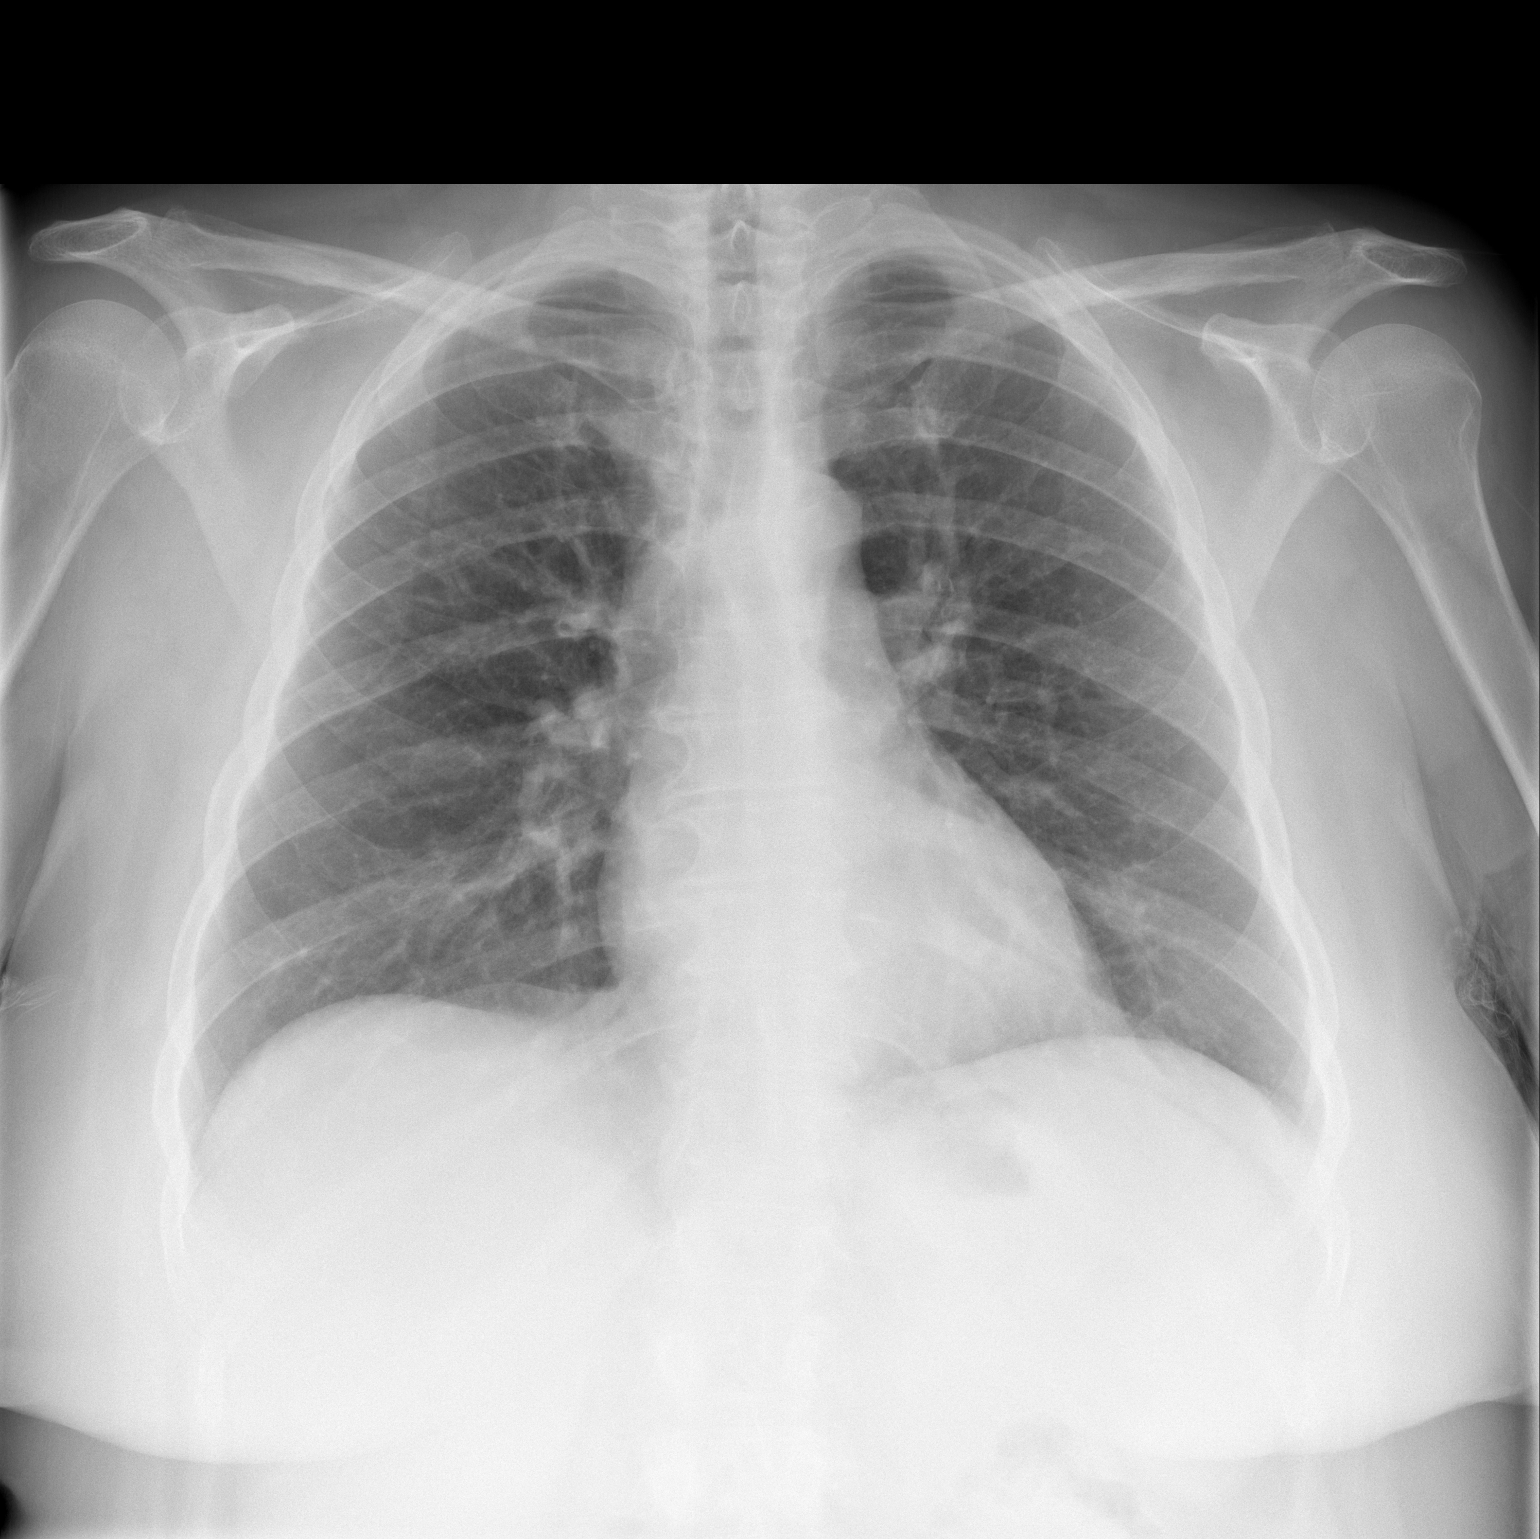

[w chest lat]
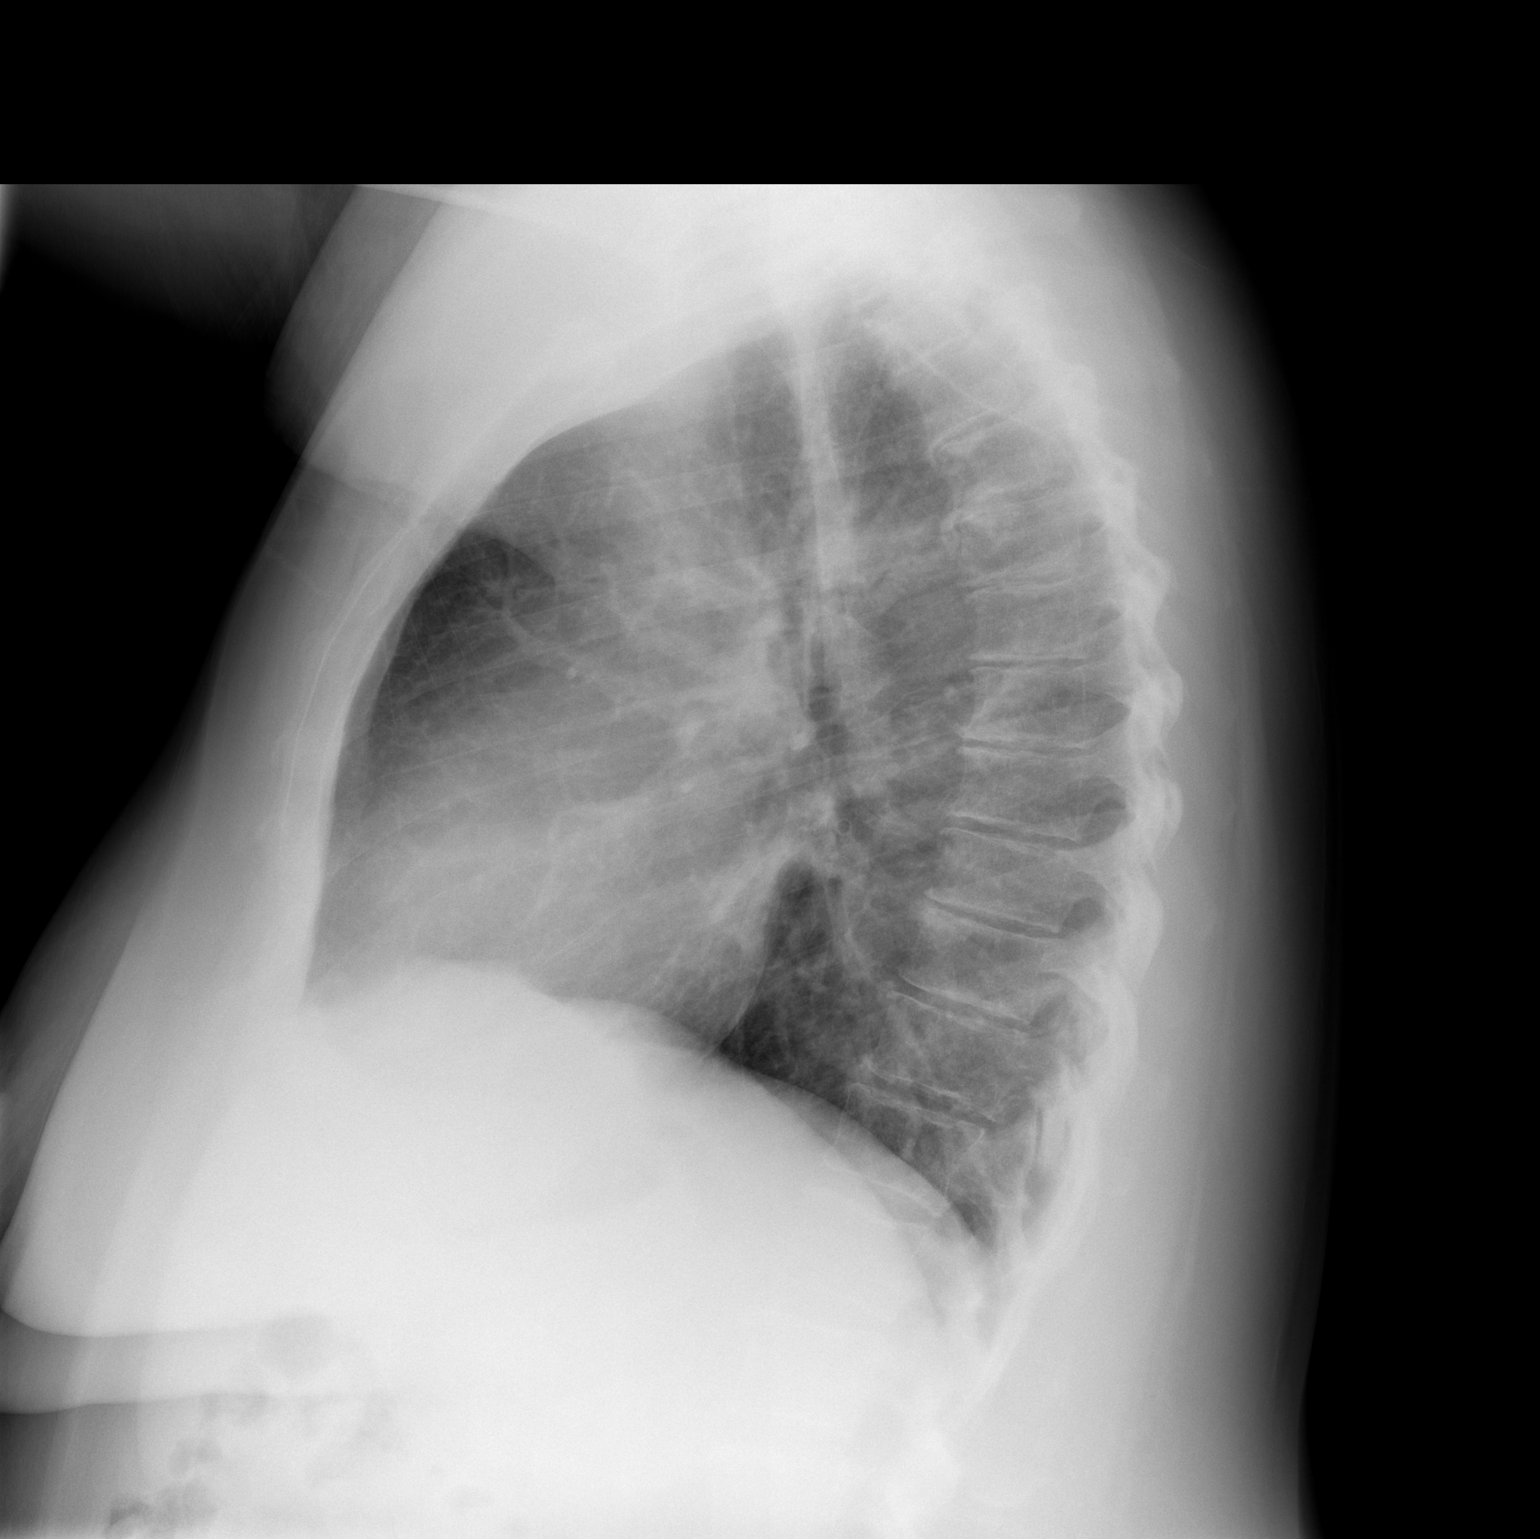

[2 of 2 positions shown; findings below may reference images not displayed]

FINDINGS: The heart size and mediastinal contours are within normal limits.
Both lungs are clear. The visualized skeletal structures are
unremarkable.
IMPRESSION: No active cardiopulmonary disease.

## 2020-08-04 ENCOUNTER — Other Ambulatory Visit: Payer: Self-pay

## 2020-08-04 ENCOUNTER — Emergency Department (HOSPITAL_COMMUNITY)
Admission: EM | Admit: 2020-08-04 | Discharge: 2020-08-04 | Disposition: A | Discharge status: Home / Self Care | Payer: BC Managed Care – PPO | Attending: Emergency Medicine | Admitting: Emergency Medicine

## 2020-08-04 DIAGNOSIS — Z87891 Personal history of nicotine dependence: Secondary | ICD-10-CM | POA: Insufficient documentation

## 2020-08-04 DIAGNOSIS — Z794 Long term (current) use of insulin: Secondary | ICD-10-CM | POA: Insufficient documentation

## 2020-08-04 DIAGNOSIS — Z7984 Long term (current) use of oral hypoglycemic drugs: Secondary | ICD-10-CM | POA: Insufficient documentation

## 2020-08-04 DIAGNOSIS — M5442 Lumbago with sciatica, left side: Secondary | ICD-10-CM | POA: Insufficient documentation

## 2020-08-04 DIAGNOSIS — E119 Type 2 diabetes mellitus without complications: Secondary | ICD-10-CM | POA: Insufficient documentation

## 2020-08-04 DIAGNOSIS — Z85828 Personal history of other malignant neoplasm of skin: Secondary | ICD-10-CM | POA: Insufficient documentation

## 2020-08-04 MED ORDER — PREDNISONE 20 MG PO TABS: 40.0000 mg | ORAL_TABLET | Freq: Every day | ORAL | 0 refills | Status: AC

## 2020-08-04 MED ORDER — METHYL SALICYLATE-LIDO-MENTHOL 4-4-5 % EX PTCH: 1.0000 | MEDICATED_PATCH | Freq: Two times a day (BID) | CUTANEOUS | 0 refills | Status: AC

## 2020-08-04 MED ORDER — DICLOFENAC EPOLAMINE 1.3 % EX PTCH
1.0000 | MEDICATED_PATCH | Freq: Two times a day (BID) | CUTANEOUS | Status: DC
  Administered 2020-08-04: 1 via TRANSDERMAL
  Filled 2020-08-04: qty 1

## 2020-08-04 MED ORDER — HYDROCODONE-ACETAMINOPHEN 5-325 MG PO TABS
1.0000 | ORAL_TABLET | Freq: Once | ORAL | Status: AC
Start: 2020-08-04 — End: 2020-08-04
  Administered 2020-08-04: 1 via ORAL
  Filled 2020-08-04: qty 1

## 2020-08-04 MED ORDER — KETOROLAC TROMETHAMINE 15 MG/ML IJ SOLN
15.0000 mg | Freq: Once | INTRAMUSCULAR | Status: AC
  Administered 2020-08-04: 15 mg via INTRAMUSCULAR
  Filled 2020-08-04: qty 1

## 2020-08-04 MED ORDER — DEXAMETHASONE SODIUM PHOSPHATE 10 MG/ML IJ SOLN
10.0000 mg | Freq: Once | INTRAMUSCULAR | Status: AC
  Administered 2020-08-04: 10 mg via INTRAMUSCULAR
  Filled 2020-08-04: qty 1

## 2020-08-04 NOTE — Discharge Instructions (Signed)
Sciatica, which is likely what is causing your pain can take some time to manage effectively.  Please take all medication as directed, and follow-up with our [facility] as needed.  Return here for concerning changes in your condition.

## 2020-08-04 NOTE — ED Provider Notes (Signed)
[facility] EMERGENCY DEPARTMENT Provider Note   CSN: [id number] Arrival date & time: 08/04/20  6712     History Chief Complaint  Patient presents with  . Back Pain    Linda Peterson is a 50 y.o. female.  HPI Patient presents with pain in the left lower back, radiating down the left leg.  Onset was atraumatic 6 days ago.  Since that time pain has been severe sustained sharp worse with motion.  No distal loss of sensation, no abdominal pain, chest pain, fever, other complaints.  No relief with Tylenol, ibuprofen.  She has no history of back surgery, fall, or other obvious precipitating event. She has not seen her physician nor another clinician since onset.    Past Medical History:  Diagnosis Date  . Diabetes mellitus without complication ([place])   . Skin cancer of face     Patient Active Problem List   Diagnosis Date Noted  . Hyponatremia 03/20/2016  . Obesity 07/09/2015  . Rectal bleeding 07/09/2015  . Diabetes mellitus ([place]) 06/03/2015  . Increased BMI 06/01/2015  . Left genital labial abscess 05/28/2015    Past Surgical History:  Procedure Laterality Date  . ABDOMINAL HYSTERECTOMY    . APPENDECTOMY    . INCISION AND DRAINAGE ABSCESS Left    genital  . SKIN BIOPSY     face  . SKIN CANCER EXCISION       OB History    Gravida  4   Para  2   Term  2   Preterm      AB  1   Living  3     SAB      IAB  1   Ectopic      Multiple      Live Births  3           Family History  Problem Relation Age of Onset  . Diabetes Father   . Ovarian cancer Paternal Grandmother   . Diabetes Paternal Grandmother   . Colon cancer Maternal Grandmother   . Breast cancer Paternal Grandfather     Social History   Tobacco Use  . Smoking status: Former Smoker    Packs/day: 0.50    Years: 20.00    Pack years: 10.00    Types: Cigarettes    Quit date: 08/16/2014    Years since quitting: 5.9  . Smokeless tobacco: Never Used  Substance Use  Topics  . Alcohol use: No    Alcohol/week: 0.0 standard drinks    Comment: occas  . Drug use: No    Home Medications Prior to Admission medications   Medication Sig Start Date End Date Taking? Authorizing Provider  albuterol (PROVENTIL HFA;VENTOLIN HFA) 108 (90 Base) MCG/ACT inhaler Inhale 1-2 puffs into the lungs every 6 (six) hours as needed for wheezing or shortness of breath. 03/13/16   Betancourt, Aura Fey, NP  amoxicillin-clavulanate (AUGMENTIN) 875-125 MG tablet Take 1 tablet by mouth every 12 (twelve) hours. 03/13/16   Betancourt, Aura Fey, NP  azithromycin (ZITHROMAX) 250 MG tablet  10/04/18   [provider]  benzonatate (TESSALON) 100 MG capsule Take 1 capsule (100 mg total) by mouth every 8 (eight) hours. 03/13/16   Betancourt, Aura Fey, NP  blood glucose meter kit and supplies Dispense based on patient and insurance preference. Use up to four times daily as directed. (FOR ICD-9 250.00, 250.01). 06/02/15   Defrancesco, Alanda Slim, MD  chlorpheniramine-HYDROcodone (TUSSIONEX) 10-8 MG/5ML SUER Take 5 mLs by mouth  every 12 (twelve) hours. X 7 days 03/21/16   Gladstone Lighter, MD  fenofibrate micronized (ANTARA) 43 MG capsule  10/20/18   [provider]  fluticasone (FLONASE) 50 MCG/ACT nasal spray Place 1 spray into both nostrils 2 (two) times daily. 03/13/16   Betancourt, Aura Fey, NP  gabapentin (NEURONTIN) 300 MG capsule Take 1 capsule (300 mg total) by mouth at bedtime. 06/01/19   Felipa Furnace, DPM  glipiZIDE (GLUCOTROL) 5 MG tablet Take 1 tablet (5 mg total) by mouth 2 (two) times daily before a meal. 03/21/16   Gladstone Lighter, MD  LANTUS 100 UNIT/ML injection  11/11/18   [provider]  meloxicam (MOBIC) 15 MG tablet  11/29/18   [provider]  metFORMIN (GLUCOPHAGE) 1000 MG tablet Take 1 tablet by mouth 2 (two) times daily. 06/18/15   [provider]  mometasone-formoterol (DULERA) 100-5 MCG/ACT AERO Inhale 2 puffs into the lungs 2 (two) times  daily. 03/21/16   Gladstone Lighter, MD  mupirocin cream (BACTROBAN) 2 % Apply topically 2 (two) times daily. 03/21/16   Gladstone Lighter, MD  predniSONE (DELTASONE) 10 MG tablet  10/04/18   [provider]  sodium chloride (OCEAN) 0.65 % SOLN nasal spray Place 2 sprays into both nostrils every 2 (two) hours while awake. 03/13/16 04/12/16  Betancourt, Aura Fey, NP  terbinafine (LAMISIL) 250 MG tablet Take 1 tablet (250 mg total) by mouth daily. 02/28/19   Felipa Furnace, DPM  traMADol (ULTRAM) 50 MG tablet Take 1 tablet (50 mg total) by mouth 3 (three) times daily. X 5 days ONLY 03/21/16   Gladstone Lighter, MD  Vitamin D, Ergocalciferol, (DRISDOL) 1.25 MG (50000 UT) CAPS capsule TAKE ONE CAPSULE BY MOUTH ONCE A WEEK 08/21/16   [provider]    Allergies    Patient has no known allergies.  Review of Systems   Review of Systems  Constitutional:       Per HPI, otherwise negative  HENT:       Per HPI, otherwise negative  Respiratory:       Per HPI, otherwise negative  Cardiovascular:       Per HPI, otherwise negative  Gastrointestinal: Negative for vomiting.  Endocrine:       Negative aside from HPI  Genitourinary:       Neg aside from HPI   Musculoskeletal:       Per HPI, otherwise negative  Skin: Negative.   Neurological: Negative for syncope.    Physical Exam Updated Vital Signs BP (!) 150/106 (BP Location: Right Arm)   Pulse 99   Temp 97.9 F (36.6 C) (Oral)   Resp 18   SpO2 97%   Physical Exam Vitals and nursing note reviewed.  Constitutional:      Appearance: She is well-developed.     Comments: Tearful adult female moving about as though she cannot find a comfortable position  HENT:     Head: Normocephalic and atraumatic.  Eyes:     Conjunctiva/sclera: Conjunctivae normal.  Cardiovascular:     Rate and Rhythm: Normal rate and regular rhythm.  Pulmonary:     Effort: Pulmonary effort is normal. No respiratory distress.     Breath sounds: Normal  breath sounds. No stridor.  Abdominal:     General: There is no distension.  Musculoskeletal:       Arms:     Comments: Patient moves all extremity spontaneously including the affected leg.  No gross loss of range of motion, no superficial  abnormalities  Skin:    General: Skin is warm and dry.     Comments: Several small areas of excoriation, no obvious cellulitis, folliculitis, patches of erythema  Neurological:     Mental Status: She is alert and oriented to person, place, and time.     Cranial Nerves: No cranial nerve deficit.     ED Results / Procedures / Treatments   Labs (all labs ordered are listed, but only abnormal results are displayed) Labs Reviewed - No data to display  EKG None  Radiology No results found.  Procedures Procedures   Medications Ordered in ED Medications  diclofenac (FLECTOR) 1.3 % 1 patch (has no administration in time range)  ketorolac (TORADOL) 15 MG/ML injection 15 mg (has no administration in time range)  dexamethasone (DECADRON) injection 10 mg (has no administration in time range)  HYDROcodone-acetaminophen (NORCO/VICODIN) 5-325 MG per tablet 1 tablet (has no administration in time range)    ED Course  I have reviewed the triage vital signs and the nursing notes.  Pertinent labs & imaging results that were available during my care of the patient were reviewed by me and considered in my medical decision making (see chart for details).   Consideration of sciatica patient received Norco, Toradol, Decadron, diclofenac patch after initial evaluation.  10:59 AM Patient in no distress, no longer crying, states that she feels better.  Adult female presents with back pain rating down the left leg. Patient has no history of sciatica, but description of symptoms, physical exam are consistent with this. No red flags suggesting CNS pathology.  No cutaneous changes suggesting infectious etiology, given improvement here with multiple  anti-inflammatories, patient appropriate for discharge with outpatient follow-up.  Final Clinical Impression(s) / ED Diagnoses Final diagnoses:  Acute left-sided low back pain with left-sided sciatica    Rx / DC Orders ED Discharge Orders         Ordered    Methyl Salicylate-Lido-Menthol 4-4-5 % PTCH  2 times daily        08/04/20 1103    predniSONE (DELTASONE) 20 MG tablet  Daily with breakfast        08/04/20 1103           Carmin Muskrat, MD 08/04/20 1103

## 2020-08-04 NOTE — ED Triage Notes (Signed)
C/o severe back and left leg pain unable to be relieved by OTC meds.

## 2020-11-06 IMAGING — CT CT HEAD W/O CM
4 series · 16 of 47 positions shown, 18 images · non-contrast
Comparison: No pertinent prior exams are available for comparison.

CLINICAL DATA: Cerebral hemorrhage suspected. Additional provided:
Severe headache, vomiting.

EXAM:
CT HEAD WITHOUT CONTRAST
TECHNIQUE: Contiguous axial images were obtained from the base of the skull
through the vertex without intravenous contrast.

[Series 3: head wo · axial · 0.41mm/px · z∈[-171,-51]mm · 7 of 32 slices shown, 9 images]
[im 4/32  brain]
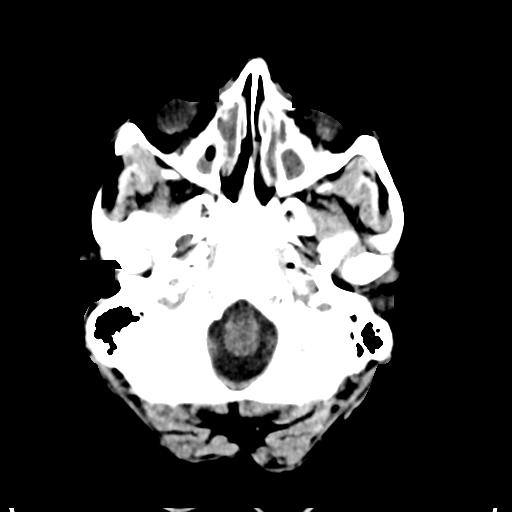
[im 4/32  bone]
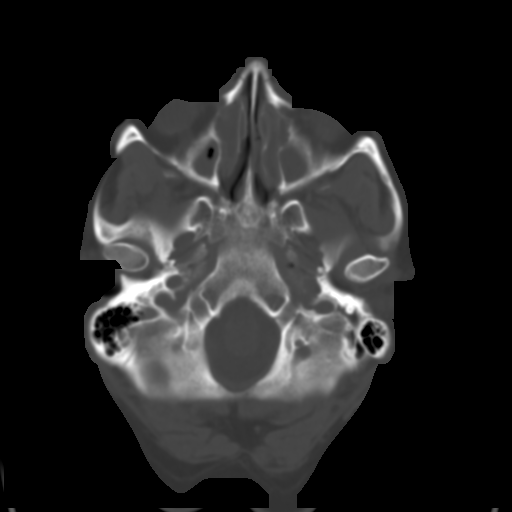
[im 8/32  brain]
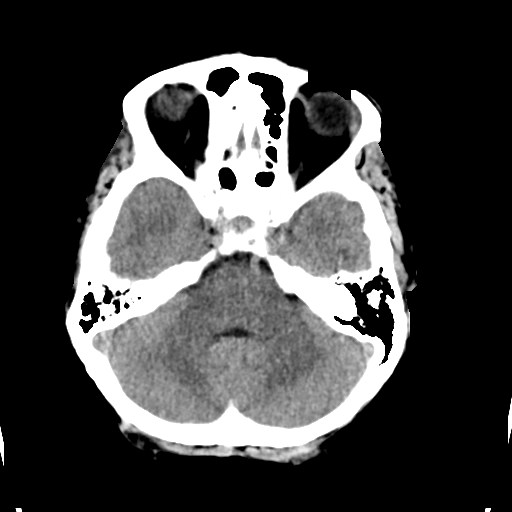
[im 12/32  brain]
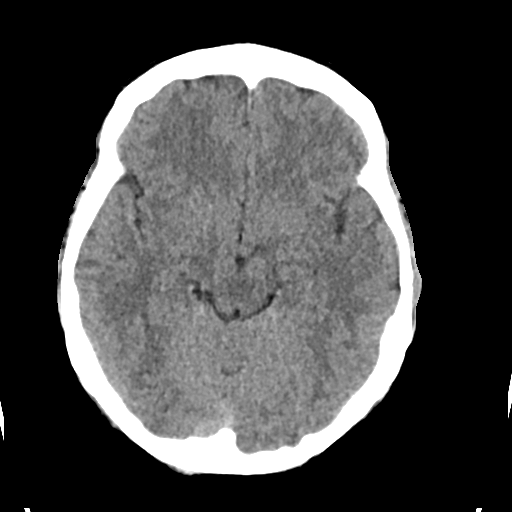
[im 16/32  brain]
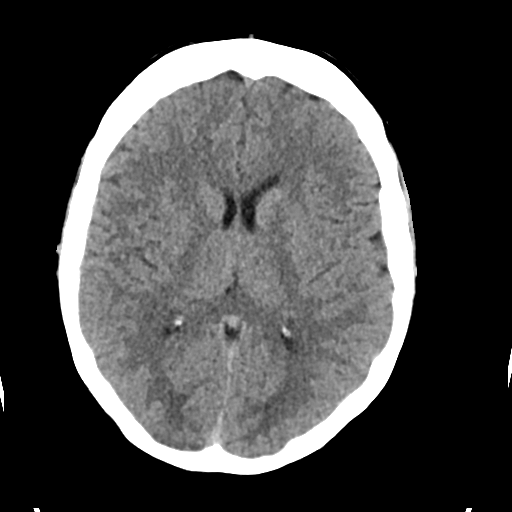
[im 20/32  brain]
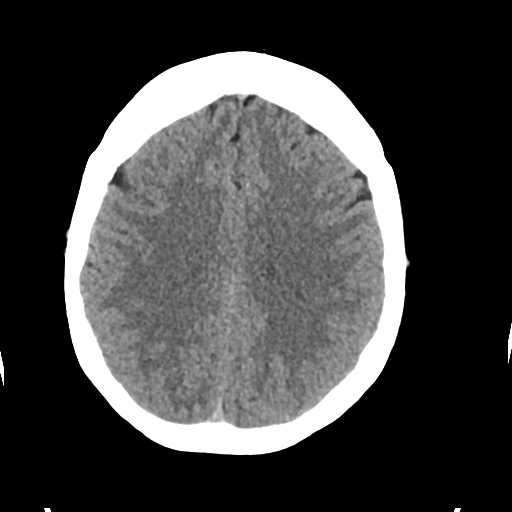
[im 20/32  bone]
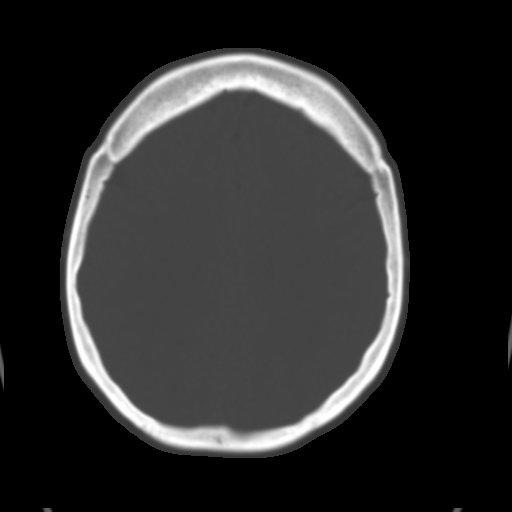
[im 24/32  brain]
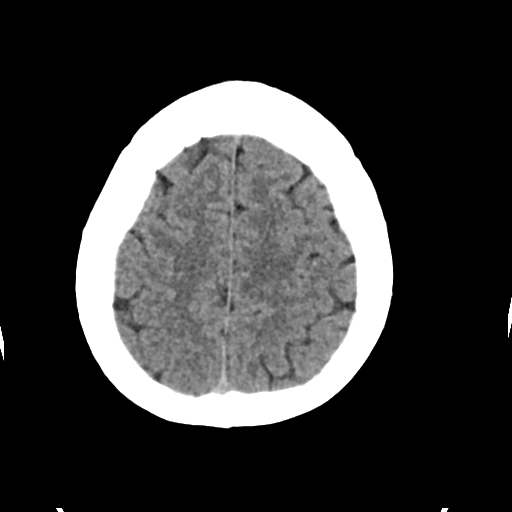
[im 28/32  brain]
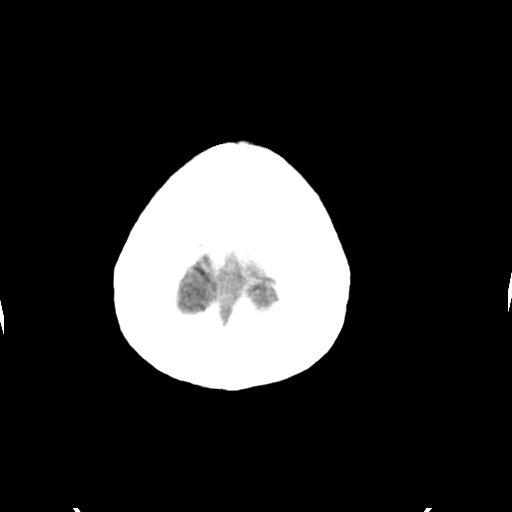

[Series 4: head bone · axial · 0.41mm/px · z∈[-172,-140]mm · 3 of 80 slices shown]
[im 8/80  bone]
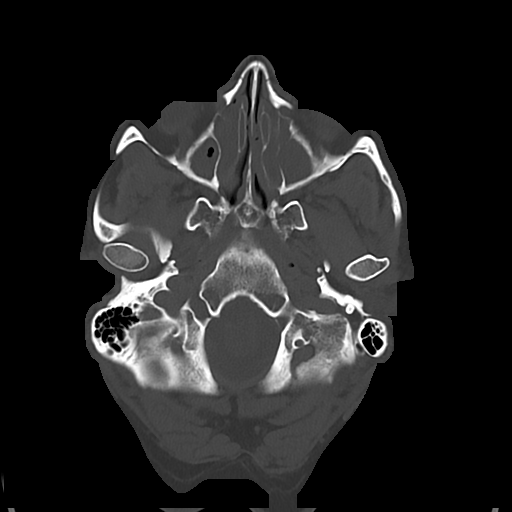
[im 16/80  bone]
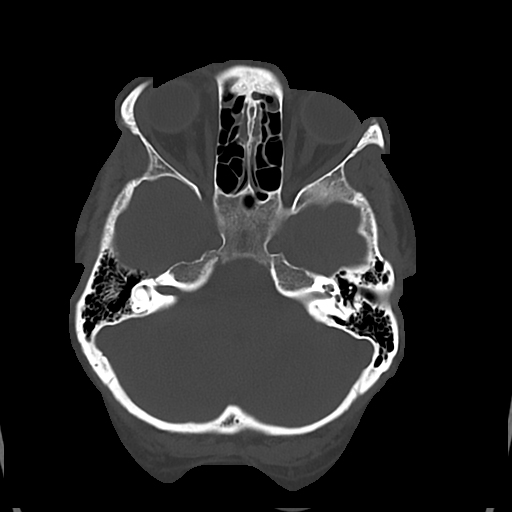
[im 24/80  bone]
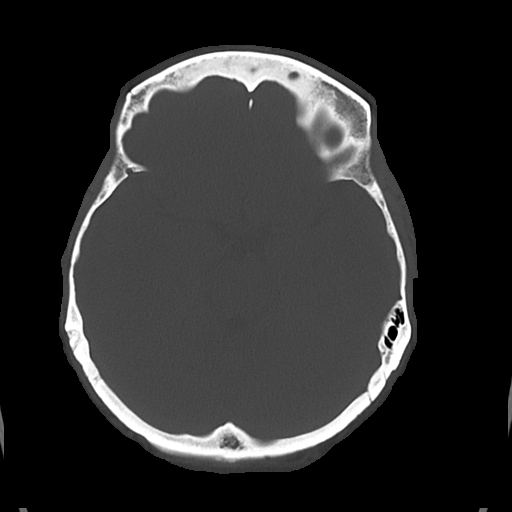

[Series 5: cor soft · coronal · 0.29mm/px · 3 of 68 slices shown]
[im 23/68  brain]
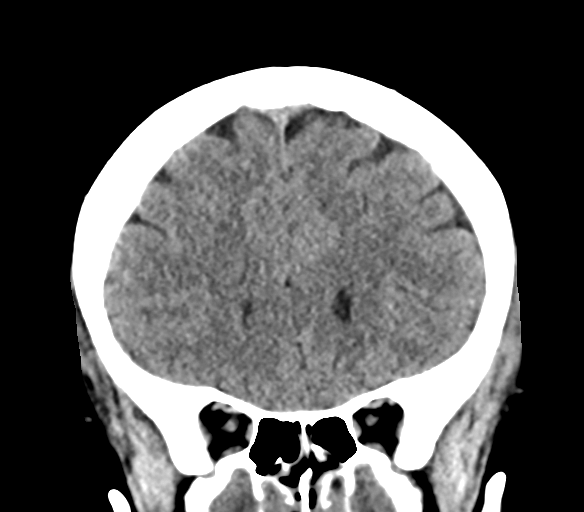
[im 30/68  brain]
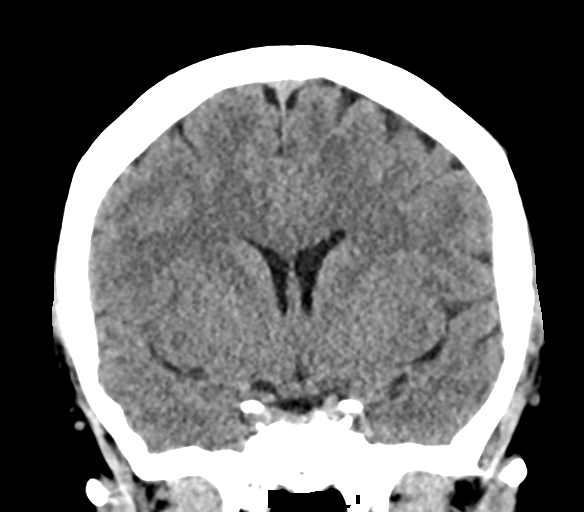
[im 38/68  brain]
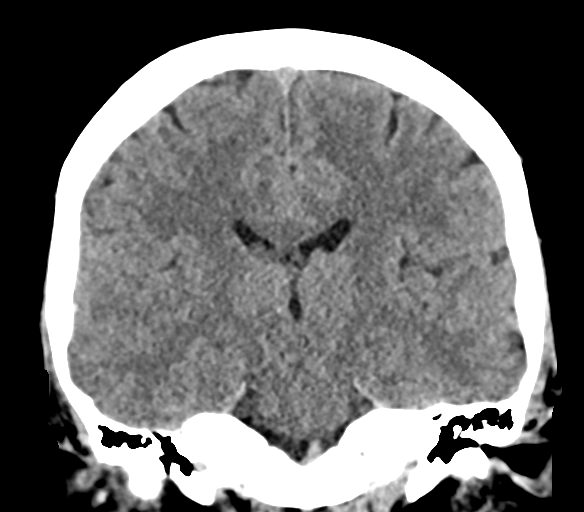

[Series 6: sag soft · sagittal · 0.29mm/px · 3 of 62 slices shown]
[im 21/62  brain]
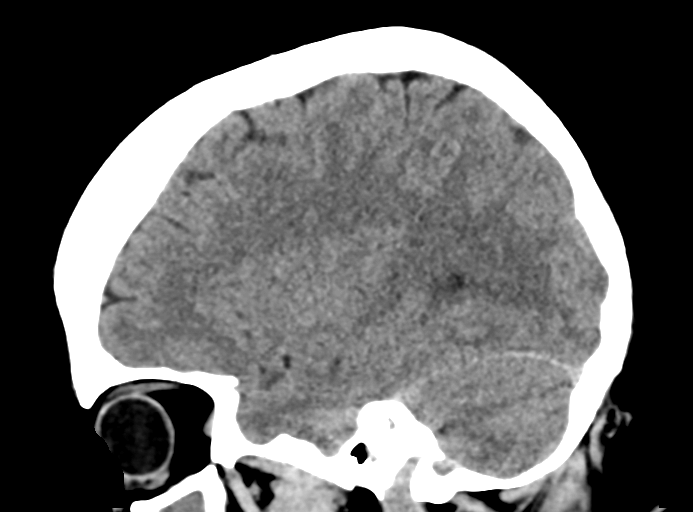
[im 31/62  brain]
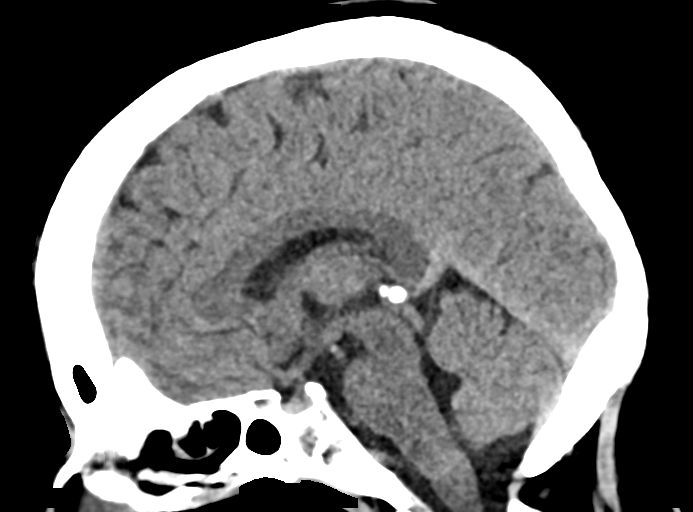
[im 41/62  brain]
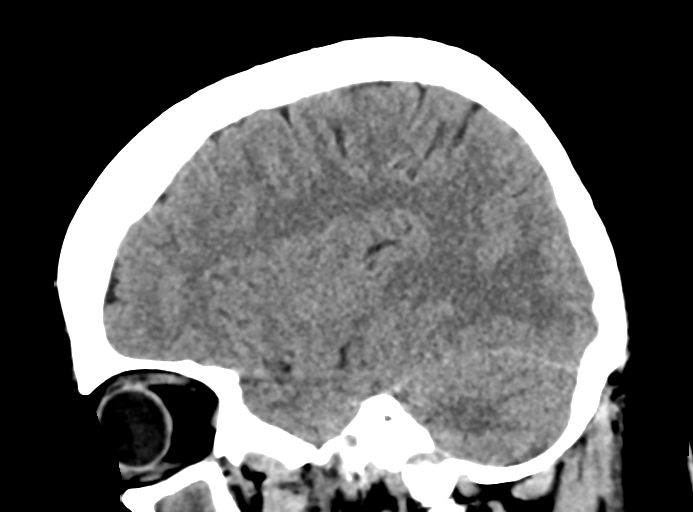

[16 of 47 positions shown; findings below may reference images not displayed]

FINDINGS: Brain:

Cerebral volume is normal for age.

There is no acute intracranial hemorrhage.

No demarcated cortical infarct.

No extra-axial fluid collection.

No evidence of intracranial mass.

No midline shift.

Vascular: No hyperdense vessel.

Skull: Normal. Negative for fracture or focal lesion.

Sinuses/Orbits: Visualized orbits show no acute finding. Moderate
ethmoid sinus mucosal thickening. Near complete opacification of the
maxillary sinuses bilaterally. No significant mastoid effusion.
IMPRESSION: Unremarkable non-contrast CT appearance of the brain. No evidence of
acute intracranial abnormality.

Near complete opacification of the maxillary sinuses bilaterally.

Moderate ethmoid sinus mucosal thickening.
